# Patient Record
Sex: Female | Born: 1960 | Race: White | Hispanic: No | Marital: Single | Smoking: Never smoker
Health system: Southern US, Community
[De-identification: ages and names within clinical notes are randomized; demographics above are authoritative.]

## PROBLEM LIST (undated history)

## (undated) DIAGNOSIS — F32A Depression, unspecified: Secondary | ICD-10-CM

## (undated) DIAGNOSIS — F1911 Other psychoactive substance abuse, in remission: Secondary | ICD-10-CM

## (undated) DIAGNOSIS — F988 Other specified behavioral and emotional disorders with onset usually occurring in childhood and adolescence: Secondary | ICD-10-CM

## (undated) DIAGNOSIS — M1990 Unspecified osteoarthritis, unspecified site: Secondary | ICD-10-CM

## (undated) DIAGNOSIS — K649 Unspecified hemorrhoids: Secondary | ICD-10-CM

## (undated) DIAGNOSIS — F329 Major depressive disorder, single episode, unspecified: Secondary | ICD-10-CM

## (undated) DIAGNOSIS — I499 Cardiac arrhythmia, unspecified: Secondary | ICD-10-CM

## (undated) DIAGNOSIS — F419 Anxiety disorder, unspecified: Secondary | ICD-10-CM

## (undated) DIAGNOSIS — L309 Dermatitis, unspecified: Secondary | ICD-10-CM

## (undated) DIAGNOSIS — R011 Cardiac murmur, unspecified: Secondary | ICD-10-CM

## (undated) HISTORY — PX: OTHER SURGICAL HISTORY: SHX169

## (undated) HISTORY — PX: UMBILICAL HERNIA REPAIR: SHX196

---

## 1997-11-21 ENCOUNTER — Ambulatory Visit (HOSPITAL_COMMUNITY): Admission: RE | Admit: 1997-11-21 | Discharge: 1997-11-21 | Payer: Self-pay | Admitting: Obstetrics and Gynecology

## 1997-11-24 ENCOUNTER — Ambulatory Visit (HOSPITAL_COMMUNITY): Admission: RE | Admit: 1997-11-24 | Discharge: 1997-11-24 | Payer: Self-pay | Admitting: Obstetrics and Gynecology

## 1999-07-23 ENCOUNTER — Encounter: Admission: RE | Admit: 1999-07-23 | Discharge: 1999-07-23 | Payer: Self-pay | Admitting: *Deleted

## 1999-07-23 ENCOUNTER — Encounter: Payer: Self-pay | Admitting: *Deleted

## 1999-10-28 ENCOUNTER — Other Ambulatory Visit: Admission: RE | Admit: 1999-10-28 | Discharge: 1999-10-28 | Payer: Self-pay | Admitting: Obstetrics and Gynecology

## 2000-11-02 ENCOUNTER — Other Ambulatory Visit: Admission: RE | Admit: 2000-11-02 | Discharge: 2000-11-02 | Payer: Self-pay | Admitting: Obstetrics and Gynecology

## 2001-11-24 ENCOUNTER — Other Ambulatory Visit: Admission: RE | Admit: 2001-11-24 | Discharge: 2001-11-24 | Payer: Self-pay | Admitting: Obstetrics and Gynecology

## 2002-12-05 ENCOUNTER — Other Ambulatory Visit: Admission: RE | Admit: 2002-12-05 | Discharge: 2002-12-05 | Payer: Self-pay | Admitting: Obstetrics and Gynecology

## 2003-01-25 ENCOUNTER — Ambulatory Visit (HOSPITAL_COMMUNITY): Admission: RE | Admit: 2003-01-25 | Discharge: 2003-01-25 | Payer: Self-pay | Admitting: Obstetrics and Gynecology

## 2003-01-25 ENCOUNTER — Encounter (INDEPENDENT_AMBULATORY_CARE_PROVIDER_SITE_OTHER): Payer: Self-pay | Admitting: Specialist

## 2004-10-25 ENCOUNTER — Other Ambulatory Visit: Admission: RE | Admit: 2004-10-25 | Discharge: 2004-10-25 | Payer: Self-pay | Admitting: Obstetrics and Gynecology

## 2009-09-29 ENCOUNTER — Emergency Department (HOSPITAL_COMMUNITY): Admission: EM | Admit: 2009-09-29 | Discharge: 2009-09-29 | Payer: Self-pay | Admitting: Emergency Medicine

## 2010-03-05 ENCOUNTER — Emergency Department (HOSPITAL_COMMUNITY): Admission: EM | Admit: 2010-03-05 | Discharge: 2010-03-06 | Payer: Self-pay | Admitting: Emergency Medicine

## 2010-10-20 ENCOUNTER — Encounter: Payer: Self-pay | Admitting: Obstetrics and Gynecology

## 2010-12-15 LAB — RAPID URINE DRUG SCREEN, HOSP PERFORMED
Barbiturates: NOT DETECTED
Opiates: NOT DETECTED

## 2010-12-15 LAB — DIFFERENTIAL
Basophils Relative: 2 % — ABNORMAL HIGH (ref 0–1)
Eosinophils Absolute: 0.2 10*3/uL (ref 0.0–0.7)
Eosinophils Relative: 3 % (ref 0–5)
Monocytes Absolute: 0.4 10*3/uL (ref 0.1–1.0)
Monocytes Relative: 8 % (ref 3–12)

## 2010-12-15 LAB — CBC
HCT: 36.7 % (ref 36.0–46.0)
Hemoglobin: 12.3 g/dL (ref 12.0–15.0)
MCHC: 33.5 g/dL (ref 30.0–36.0)
MCV: 92 fL (ref 78.0–100.0)
RBC: 3.98 MIL/uL (ref 3.87–5.11)
RDW: 14.3 % (ref 11.5–15.5)

## 2010-12-15 LAB — URINALYSIS, ROUTINE W REFLEX MICROSCOPIC
Protein, ur: NEGATIVE mg/dL
Specific Gravity, Urine: 1.018 (ref 1.005–1.030)
Urobilinogen, UA: 1 mg/dL (ref 0.0–1.0)

## 2010-12-15 LAB — COMPREHENSIVE METABOLIC PANEL
ALT: 18 U/L (ref 0–35)
AST: 21 U/L (ref 0–37)
Albumin: 3.6 g/dL (ref 3.5–5.2)
Alkaline Phosphatase: 46 U/L (ref 39–117)
BUN: 13 mg/dL (ref 6–23)
CO2: 26 mEq/L (ref 19–32)
Calcium: 8.9 mg/dL (ref 8.4–10.5)
Chloride: 110 mEq/L (ref 96–112)
Creatinine, Ser: 0.59 mg/dL (ref 0.4–1.2)
GFR calc Af Amer: >60 mL/min (ref >60)
GFR calc non Af Amer: >60 mL/min (ref >60)
Glucose, Bld: 95 mg/dL (ref 70–99)
Potassium: 4 mEq/L (ref 3.5–5.1)
Sodium: 140 mEq/L (ref 135–145)
Total Bilirubin: 0.6 mg/dL (ref 0.3–1.2)
Total Protein: 6.3 g/dL (ref 6.0–8.3)

## 2010-12-15 LAB — PREGNANCY, URINE: Preg Test, Ur: NEGATIVE

## 2010-12-15 LAB — ETHANOL: Alcohol, Ethyl (B): <5 mg/dL (ref 0–10)

## 2010-12-16 LAB — URINALYSIS, ROUTINE W REFLEX MICROSCOPIC
Bilirubin Urine: NEGATIVE
Glucose, UA: NEGATIVE mg/dL
Protein, ur: NEGATIVE mg/dL

## 2010-12-16 LAB — DIFFERENTIAL
Eosinophils Absolute: 0.2 10*3/uL (ref 0.0–0.7)
Eosinophils Relative: 3 % (ref 0–5)
Lymphs Abs: 2 10*3/uL (ref 0.7–4.0)
Monocytes Relative: 9 % (ref 3–12)

## 2010-12-16 LAB — CBC
HCT: 39.5 % (ref 36.0–46.0)
MCV: 89.6 fL (ref 78.0–100.0)
RBC: 4.41 MIL/uL (ref 3.87–5.11)
WBC: 5.9 10*3/uL (ref 4.0–10.5)

## 2010-12-16 LAB — URINE MICROSCOPIC-ADD ON

## 2010-12-16 LAB — BASIC METABOLIC PANEL
BUN: 12 mg/dL (ref 6–23)
Chloride: 108 mEq/L (ref 96–112)
GFR calc Af Amer: >60 mL/min (ref >60)
Potassium: 3.7 mEq/L (ref 3.5–5.1)

## 2010-12-16 LAB — RAPID URINE DRUG SCREEN, HOSP PERFORMED
Barbiturates: NOT DETECTED
Cocaine: NOT DETECTED

## 2010-12-16 LAB — ETHANOL: Alcohol, Ethyl (B): <5 mg/dL (ref 0–10)

## 2011-02-14 NOTE — Op Note (Signed)
NAME:  Mary Sawyer, Mary Sawyer                  ACCOUNT NO.:  [id number]   MEDICAL RECORD NO.:  [id number]                   PATIENT TYPE:  AMB   LOCATION:  SDC                                  FACILITY:  [facility]   PHYSICIAN:  Duke Salvia. Marcelle Overlie, M.D.            DATE OF BIRTH:  [birth year]   DATE OF PROCEDURE:  01/25/2003  DATE OF DISCHARGE:                                 OPERATIVE REPORT   PREOPERATIVE DIAGNOSES:  1. Abnormal uterine bleeding.  2. Menorrhagia.  3. Requests sterilization.   POSTOPERATIVE DIAGNOSES:  1. Abnormal uterine bleeding.  2. Menorrhagia.  3. Requests sterilization.   PROCEDURE:  1. Laparoscopic tubal ligation by Filshie clip application.  2. Dilatation and curettage.  3. Hysteroscopy with cryo ablation.   SURGEON:  Duke Salvia. Marcelle Overlie, M.D.   ANESTHESIA:  General endotracheal.   COMPLICATIONS:  None.   DRAINS:  In-and-out Foley catheter.   ESTIMATED BLOOD LOSS:  Less than 5 mL.   PROCEDURE AND FINDINGS:  The patient is taken to the operating room after an  adequate level of general endotracheal anesthesia was obtained with the  patient's legs in stirrups.  The abdomen, perineum, and vagina prepped and  draped in a usual manner for laparoscopy, D&C.  The bladder was drained.  EUA was carried out.  The uterus was mid to posterior, normal size.  Adnexa  negative.  Cervix grasped with tenaculum.  Hulka was positioned for  posterior uterus.  Attention directed to the abdomen where a 2 cm  subumbilical incision was made after infiltrating with 0.5% Marcaine plain.  Small incision was made.  The Veress needle was introduced without  difficulty.  Its intra-abdominal position was verified by pressure and water  testing.  After 2.5 L pneumoperitoneum was then created laparoscopic trocar  and sleeve were then introduced without difficulty.  There was no evidence  of any bleeding or trauma.  The patient placed in Trendelenburg and the  uterus anteflexed.  The  pelvic findings were inspected.  The uterus was  retroflexed, but no other abnormalities in either adnexa or cul-de-sac were  noted.  0.5% Marcaine plain 4-5 mL was dripped across each tube from the  cornu to fimbriated end.  Filshie clip applicator was positioned.  The  Filshie clip applied to the right tube after positive ID and tracing it to  the fimbriated end.  This was applied 2 cm from the cornu at a right angle  completely engulfing the tube with excellent application.  The exact same  repeated on the opposite side.  After the Filshie clip applicator was  removed the scope was reinserted and pictures were taken showing excellent  application on either side.  The scope was left in temporarily for the  vaginal portion.  The legs were extended.  Speculum was positioned.  Cervix  grasped with a tenaculum.  Was sounded to 9.5 cm, progressively dilated to a  29 Pratt.  The cryo probe was  then inserted into the cornu at the  appropriate depth.  A six minute freeze cycle was carried out and thawed and  removed.  Four to five minutes were allowed to pass to allow the ice ball to  thaw.  The cryo probe was then repositioned in the right cornu and another  six minute freeze cycle was carried out.  This was observed laparoscopically  during the procedure which was uneventful.  This was thawed and removed.  She tolerated this well.  Went to recovery room in good condition.                                               Richard M. Marcelle Overlie, M.D.    RMH/MEDQ  D:  01/25/2003  T:  01/25/2003  Job:  [id number]

## 2011-02-14 NOTE — H&P (Signed)
   NAME:  Mary Sawyer, Mary Sawyer                  ACCOUNT NO.:  [id number]   MEDICAL RECORD NO.:  [id number]                   PATIENT TYPE:  AMB   LOCATION:  SDC                                  FACILITY:  [facility]   PHYSICIAN:  Duke Salvia. Marcelle Overlie, M.D.            DATE OF BIRTH:  12/30/1960   DATE OF ADMISSION:  01/24/2003  DATE OF DISCHARGE:                                HISTORY & PHYSICAL   CHIEF COMPLAINT:  Request for permanent sterilization, abnormal bleeding  with menorrhagia.   HISTORY OF PRESENT ILLNESS:  A 50 year old separated female, gravida 2, para  2, using abstinence for contraception currently. In the past several years  she has had problems with heavy irregular bleeding that did not respond well  to OCP's.  An FHD done January 05, 2003, showed a uterus that was 8.6 x 5.3 x  4.4, endometrium was 10.4 mm, adnexa negative.  On saline infusion, there  was irregular build-up on the posterior and the anterior wall.  We discussed  several treatment options.  She is scheduled now for D&C, hysteroscopy,  cryoablation, along with tubal ligation.  This procedure including the  permanence of the tubal procedure, failure rate of 2 to 3 per 1000 reviewed,  other complications such as bleeding, infection, the possible need for open  or additional surgery reviewed.  We discussed a 30 to 40% rate of amenorrhea  post cryo with an 80% hypomenorrhea rate post cryo which she understands.   MEDICATIONS:  Wellbutrin and Zoloft, Xanax p.r.n.   PAST MEDICAL HISTORY:  She is separated and not currently sexually active.   PAST OBSTETRICAL HISTORY:  She has had two vaginal deliveries at term  without complications.   REVIEW OF SYSTEMS:  Otherwise unremarkable.  She smokes rarely with  occasional alcohol use. She is exercising regularly. Last Pap March of 2004  was normal.   PHYSICAL EXAMINATION:  VITAL SIGNS: Temperature 98.2, blood pressure 120/78.  HEENT:  Unremarkable.  NECK:  Supple without  masses.  LUNGS:  Clear.  HEART:  Regular rate and rhythm without murmurs, rubs, or gallops noted.  BREASTS: Without masses.  ABDOMEN:  Soft, flat, and nontender.  PELVIC:  Normal external genitalia. Vagina and cervix are clear. Uterus is  midposition, normal size, adnexa negative.   IMPRESSION:  1. Menorrhagia with abnormal uterine bleeding.  2. Request for permanent sterilization.    PLAN:  D&C, hysteroscopy, cryoablation, tubal ligation by Filshie clip  application.  The procedure and risks reviewed as above.                                               Richard M. Marcelle Overlie, M.D.    RMH/MEDQ  D:  01/24/2003  T:  01/24/2003  Job:  [id number]

## 2014-10-20 NOTE — Progress Notes (Signed)
10-20-14 Need MD order entry in Epic, PAT appointment is 10-23-14 1300.

## 2014-10-23 ENCOUNTER — Encounter (HOSPITAL_COMMUNITY): Payer: Self-pay

## 2014-10-23 ENCOUNTER — Encounter (HOSPITAL_COMMUNITY)
Admission: RE | Admit: 2014-10-23 | Discharge: 2014-10-23 | Disposition: A | Discharge status: Home / Self Care | Payer: 59 | Source: Ambulatory Visit | Attending: Orthopedic Surgery | Admitting: Orthopedic Surgery

## 2014-10-23 DIAGNOSIS — Z01812 Encounter for preprocedural laboratory examination: Secondary | ICD-10-CM | POA: Insufficient documentation

## 2014-10-23 DIAGNOSIS — I341 Nonrheumatic mitral (valve) prolapse: Secondary | ICD-10-CM | POA: Insufficient documentation

## 2014-10-23 DIAGNOSIS — M1612 Unilateral primary osteoarthritis, left hip: Secondary | ICD-10-CM | POA: Insufficient documentation

## 2014-10-23 HISTORY — DX: Cardiac arrhythmia, unspecified: I49.9

## 2014-10-23 HISTORY — DX: Unspecified hemorrhoids: K64.9

## 2014-10-23 HISTORY — DX: Major depressive disorder, single episode, unspecified: F32.9

## 2014-10-23 HISTORY — DX: Dermatitis, unspecified: L30.9

## 2014-10-23 HISTORY — DX: Other psychoactive substance abuse, in remission: F19.11

## 2014-10-23 HISTORY — DX: Unspecified osteoarthritis, unspecified site: M19.90

## 2014-10-23 HISTORY — DX: Depression, unspecified: F32.A

## 2014-10-23 HISTORY — DX: Other specified behavioral and emotional disorders with onset usually occurring in childhood and adolescence: F98.8

## 2014-10-23 HISTORY — DX: Cardiac murmur, unspecified: R01.1

## 2014-10-23 HISTORY — DX: Anxiety disorder, unspecified: F41.9

## 2014-10-23 LAB — PROTIME-INR
INR: 1.09 (ref 0.00–1.49)
Prothrombin Time: 14.2 seconds (ref 11.6–15.2)

## 2014-10-23 LAB — SURGICAL PCR SCREEN
MRSA, PCR: NEGATIVE
Staphylococcus aureus: NEGATIVE

## 2014-10-23 LAB — URINALYSIS, ROUTINE W REFLEX MICROSCOPIC
Bilirubin Urine: NEGATIVE
Glucose, UA: NEGATIVE mg/dL
KETONES UR: NEGATIVE mg/dL
Leukocytes, UA: NEGATIVE
Nitrite: NEGATIVE
PH: 6 (ref 5.0–8.0)
Protein, ur: NEGATIVE mg/dL
SPECIFIC GRAVITY, URINE: 1.014 (ref 1.005–1.030)
Urobilinogen, UA: 0.2 mg/dL (ref 0.0–1.0)

## 2014-10-23 LAB — APTT: APTT: 31 s (ref 24–37)

## 2014-10-23 LAB — URINE MICROSCOPIC-ADD ON

## 2014-10-23 NOTE — Progress Notes (Signed)
Urinalysis and micro results per PAT visit 10/23/2014 in epic sent to Dr Linna CapriceSwinteck

## 2014-10-23 NOTE — Patient Instructions (Addendum)
20 Mary Sawyer  10/23/2014   Your procedure is scheduled on:    Thursday November 02, 2014  [facility] and follow signs to  Short Stay Center arrive at 9:30 AM.   Call this number if you have problems the morning of surgery [phone] or Presurgical Testing [phone].   Remember:  Do not eat food or drink liquids :After Midnight.     Take these medicines the morning of surgery with A SIP OF WATER: Gabapentin                               You may not have any metal on your body including hair pins and piercings  Do not wear jewelry, make-up, lotions, powders, prefume or deodorant.  Do not shave body hair  48 hours(2 days) of CHG soap use.                Do not bring valuables to the hospital.  IS NOT RESPONSIBLE FOR VALUABLES.  Contacts, dentures or bridgework may not be worn into surgery.  Leave suitcase in the car. After surgery it may be brought to your room.  For patients admitted to the hospital, checkout time is 11:00 AM the day of discharge.     Special Instructions: review fact sheets for MRSA information, Blood Transfusion fact sheet, Incentive Spirometry.  ________________________________________________________________________  [facility] - Preparing for Surgery Before surgery, you can play an important role.  Because skin is not sterile, your skin needs to be as free of germs as possible.  You can reduce the number of germs on your skin by washing with CHG (chlorahexidine gluconate) soap before surgery.  CHG is an antiseptic cleaner which kills germs and bonds with the skin to continue killing germs even after washing. Please DO NOT use if you have an allergy to CHG or antibacterial soaps.  If your skin becomes reddened/irritated stop using the CHG and inform your nurse when you arrive at Short Stay. Do not shave (including legs and underarms) for at least 48 hours prior to the first CHG shower.  You may shave your  face/neck. Please follow these instructions carefully:  1.  Shower with CHG Soap the night before surgery and the  morning of Surgery.  2.  If you choose to wash your hair, wash your hair first as usual with your  normal  shampoo.  3.  After you shampoo, rinse your hair and body thoroughly to remove the  shampoo.                           4.  Use CHG as you would any other liquid soap.  You can apply chg directly  to the skin and wash                       Gently with a scrungie or clean washcloth.  5.  Apply the CHG Soap to your body ONLY FROM THE NECK DOWN.   Do not use on face/ open                           Wound or open sores. Avoid contact with eyes, ears mouth and genitals (private parts).  Wash face,  Genitals (private parts) with your normal soap.             6.  Wash thoroughly, paying special attention to the area where your surgery  will be performed.  7.  Thoroughly rinse your body with warm water from the neck down.  8.  DO NOT shower/wash with your normal soap after using and rinsing off  the CHG Soap.                9.  Pat yourself dry with a clean towel.            10.  Wear clean pajamas.            11.  Place clean sheets on your bed the night of your first shower and do not  sleep with pets. Day of Surgery : Do not apply any lotions/deodorants the morning of surgery.  Please wear clean clothes to the hospital/surgery center.  FAILURE TO FOLLOW THESE INSTRUCTIONS MAY RESULT IN THE CANCELLATION OF YOUR SURGERY PATIENT SIGNATURE_________________________________  NURSE SIGNATURE__________________________________  ________________________________________________________________________

## 2014-10-23 NOTE — Progress Notes (Addendum)
Spoke with Dr Acey Lavarignan / anesthesia in regards to pt stating she had not eaten or drank anything prior to PAT visit gave pt a cup of water when informed would have to obtain urine specimen pt stated if she did give urine would be dark pt did give sample - large amount of clear light yellow urine pt denies ETOH and drug use but was noted in H&P obtained per Dr Wynelle LinkSun pt has hx of substance abuse anesthesia to see day of surgery Dr Acey Lavarignan also aware of pts HR and EKG reading

## 2014-10-23 NOTE — Progress Notes (Addendum)
Clearance note per chart Dr Deatra JamesVyvyan Sun 10/18/2014  LOV note per chart Dr Deatra JamesVyvyan Sun 10/18/2014  BMP and CBCD results per chart 10/18/2014  EKG per chart 10/18/2014

## 2014-10-24 ENCOUNTER — Other Ambulatory Visit: Payer: Self-pay | Admitting: Orthopedic Surgery

## 2014-10-24 NOTE — H&P (Signed)
TOTAL HIP ADMISSION H&P  Patient is admitted for left total hip arthroplasty.  Subjective:  Chief Complaint: left hip pain  HPI: Mary Sawyer, 54 y.o. female, has a history of pain and functional disability in the left hip(s) due to arthritis and patient has failed non-surgical conservative treatments for greater than 12 weeks to include NSAID's and/or analgesics, flexibility and strengthening excercises and activity modification.  Onset of symptoms was gradual starting 2 years ago with gradually worsening course since that time.The patient noted no past surgery on the left hip(s).  Patient currently rates pain in the left hip at 10 out of 10 with activity. Patient has night pain, worsening of pain with activity and weight bearing, pain that interfers with activities of daily living and pain with passive range of motion. Patient has evidence of subchondral cysts, subchondral sclerosis, periarticular osteophytes and joint space narrowing by imaging studies. This condition presents safety issues increasing the risk of falls. There is no current active infection.  There are no active problems to display for this patient.  Past Medical History  Diagnosis Date  . Dysrhythmia     MVP  . Heart murmur   . Depression   . Arthritis   . Anxiety   . Eczema     hx of   . ADD (attention deficit disorder)     hx of noted per H&P per Dr Sun 10/18/2014   . Hemorrhoids     hx of per H&P per Dr Sun 10/18/2014   . History of substance abuse     pt denies noted per H&P per Dr Sun 10/18/2014 ETOH and marijuana    Past Surgical History  Procedure Laterality Date  . Uterine ablation      hx of   . Colonscopy     . Umbilical hernia repair      hx of      (Not in a hospital admission) No Known Allergies  History  Substance Use Topics  . Smoking status: Never Smoker   . Smokeless tobacco: Never Used  . Alcohol Use: No    No family history on file.   Review of Systems  All other systems  reviewed and are negative.   Objective:  Physical Exam  Constitutional: She is oriented to person, place, and time. She appears well-developed and well-nourished.  HENT:  Head: Normocephalic and atraumatic.  Eyes: EOM are normal. Pupils are equal, round, and reactive to light.  Neck: Normal range of motion. Neck supple.  Cardiovascular: Normal rate, regular rhythm, normal heart sounds and intact distal pulses.  Exam reveals no gallop and no friction rub.   No murmur heard. Respiratory: Effort normal and breath sounds normal. No respiratory distress. She has no wheezes. She has no rales.  GI: Soft. Bowel sounds are normal. She exhibits no distension. There is no tenderness. There is no rebound and no guarding.  Genitourinary:  deferred  Musculoskeletal:  L hip with reduced ROM and pain. NVI.  Neurological: She is alert and oriented to person, place, and time.  Skin: Skin is warm and dry.  Psychiatric: She has a normal mood and affect.    Vital signs in last 24 hours: @VSRANGES@  Labs:   Estimated body mass index is 25.84 kg/(m^2) as calculated from the following:   Height as of 10/23/14: 5' 6" (1.676 m).   Weight as of 10/23/14: 72.576 kg (160 lb).   Imaging Review Plain radiographs demonstrate severe degenerative joint disease of the left hip(s).   The bone quality appears to be satisfactory for age and reported activity level.  Assessment/Plan:  End stage arthritis, left hip(s)  The patient history, physical examination, clinical judgement of the provider and imaging studies are consistent with end stage degenerative joint disease of the left hip(s) and total hip arthroplasty is deemed medically necessary. The treatment options including medical management, injection therapy, arthroscopy and arthroplasty were discussed at length. The risks and benefits of total hip arthroplasty were presented and reviewed. The risks due to aseptic loosening, infection, stiffness,  dislocation/subluxation,  thromboembolic complications and other imponderables were discussed.  The patient acknowledged the explanation, agreed to proceed with the plan and consent was signed. Patient is being admitted for inpatient treatment for surgery, pain control, PT, OT, prophylactic antibiotics, VTE prophylaxis, progressive ambulation and ADL's and discharge planning.The patient is planning to be discharged home with home health services 

## 2014-11-02 ENCOUNTER — Inpatient Hospital Stay (HOSPITAL_COMMUNITY)
Admission: RE | Admit: 2014-11-02 | Discharge: 2014-11-03 | DRG: 470 | Disposition: A | Discharge status: Home Health | Payer: 59 | Source: Ambulatory Visit | Attending: Orthopedic Surgery | Admitting: Orthopedic Surgery

## 2014-11-02 ENCOUNTER — Inpatient Hospital Stay (HOSPITAL_COMMUNITY): Payer: 59

## 2014-11-02 ENCOUNTER — Inpatient Hospital Stay (HOSPITAL_COMMUNITY): Payer: 59 | Admitting: Anesthesiology

## 2014-11-02 ENCOUNTER — Encounter (HOSPITAL_COMMUNITY)
Admission: RE | Disposition: A | Discharge status: Home Health | Payer: Self-pay | Source: Ambulatory Visit | Attending: Orthopedic Surgery

## 2014-11-02 ENCOUNTER — Encounter (HOSPITAL_COMMUNITY): Payer: Self-pay | Admitting: *Deleted

## 2014-11-02 DIAGNOSIS — Z01812 Encounter for preprocedural laboratory examination: Secondary | ICD-10-CM

## 2014-11-02 DIAGNOSIS — Z09 Encounter for follow-up examination after completed treatment for conditions other than malignant neoplasm: Secondary | ICD-10-CM

## 2014-11-02 DIAGNOSIS — R52 Pain, unspecified: Secondary | ICD-10-CM

## 2014-11-02 DIAGNOSIS — M1612 Unilateral primary osteoarthritis, left hip: Secondary | ICD-10-CM | POA: Diagnosis present

## 2014-11-02 DIAGNOSIS — M25552 Pain in left hip: Secondary | ICD-10-CM | POA: Diagnosis present

## 2014-11-02 HISTORY — PX: TOTAL HIP ARTHROPLASTY: SHX124

## 2014-11-02 LAB — TYPE AND SCREEN
ABO/RH(D): A POS
ANTIBODY SCREEN: NEGATIVE

## 2014-11-02 LAB — ABO/RH: ABO/RH(D): A POS

## 2014-11-02 SURGERY — ARTHROPLASTY, HIP, TOTAL, ANTERIOR APPROACH
Anesthesia: Spinal | Site: Hip | Laterality: Left

## 2014-11-02 MED ORDER — VITAMIN C 500 MG PO TABS
500.0000 mg | ORAL_TABLET | Freq: Every day | ORAL | Status: DC
  Administered 2014-11-03: 500 mg via ORAL
  Filled 2014-11-02: qty 1

## 2014-11-02 MED ORDER — EPHEDRINE SULFATE 50 MG/ML IJ SOLN
INTRAMUSCULAR | Status: DC | PRN
  Administered 2014-11-02: 15 mg via INTRAVENOUS

## 2014-11-02 MED ORDER — MENTHOL 3 MG MT LOZG
1.0000 | LOZENGE | OROMUCOSAL | Status: DC | PRN
  Filled 2014-11-02: qty 9

## 2014-11-02 MED ORDER — SODIUM CHLORIDE 0.9 % IJ SOLN
INTRAMUSCULAR | Status: DC | PRN
  Administered 2014-11-02: 30 mL

## 2014-11-02 MED ORDER — DEXAMETHASONE SODIUM PHOSPHATE 10 MG/ML IJ SOLN
INTRAMUSCULAR | Status: DC | PRN
  Administered 2014-11-02: 10 mg via INTRAVENOUS

## 2014-11-02 MED ORDER — HYDROCODONE-ACETAMINOPHEN 5-325 MG PO TABS
1.0000 | ORAL_TABLET | ORAL | Status: DC | PRN
  Administered 2014-11-02 – 2014-11-03 (×5): 2 via ORAL
  Filled 2014-11-02 (×5): qty 2

## 2014-11-02 MED ORDER — LAMOTRIGINE 200 MG PO TABS
200.0000 mg | ORAL_TABLET | Freq: Every day | ORAL | Status: DC
  Administered 2014-11-02: 200 mg via ORAL
  Filled 2014-11-02 (×2): qty 1

## 2014-11-02 MED ORDER — DOCUSATE SODIUM 100 MG PO CAPS
100.0000 mg | ORAL_CAPSULE | Freq: Two times a day (BID) | ORAL | Status: DC
  Administered 2014-11-02 – 2014-11-03 (×2): 100 mg via ORAL

## 2014-11-02 MED ORDER — PROPOFOL 10 MG/ML IV BOLUS
INTRAVENOUS | Status: AC
Start: 2014-11-02 — End: 2014-11-02
  Filled 2014-11-02: qty 20

## 2014-11-02 MED ORDER — LACTATED RINGERS IV SOLN
INTRAVENOUS | Status: DC
  Administered 2014-11-03: 02:00:00 via INTRAVENOUS

## 2014-11-02 MED ORDER — KETOROLAC TROMETHAMINE 30 MG/ML IJ SOLN
INTRAMUSCULAR | Status: AC
  Filled 2014-11-02: qty 1

## 2014-11-02 MED ORDER — 0.9 % SODIUM CHLORIDE (POUR BTL) OPTIME
TOPICAL | Status: DC | PRN
  Administered 2014-11-02: 1000 mL

## 2014-11-02 MED ORDER — SENNA 8.6 MG PO TABS
1.0000 | ORAL_TABLET | Freq: Two times a day (BID) | ORAL | Status: DC
  Administered 2014-11-02 – 2014-11-03 (×2): 8.6 mg via ORAL

## 2014-11-02 MED ORDER — PROMETHAZINE HCL 25 MG/ML IJ SOLN: 6.2500 mg | INTRAMUSCULAR | Status: DC | PRN

## 2014-11-02 MED ORDER — HYDROMORPHONE HCL 1 MG/ML IJ SOLN
INTRAMUSCULAR | Status: AC
  Filled 2014-11-02: qty 1

## 2014-11-02 MED ORDER — ISOPROPYL ALCOHOL 70 % SOLN
Status: AC
  Filled 2014-11-02: qty 480

## 2014-11-02 MED ORDER — ONDANSETRON HCL 4 MG/2ML IJ SOLN
INTRAMUSCULAR | Status: DC | PRN
  Administered 2014-11-02: 4 mg via INTRAVENOUS

## 2014-11-02 MED ORDER — PROPOFOL 10 MG/ML IV BOLUS
INTRAVENOUS | Status: AC
  Filled 2014-11-02: qty 20

## 2014-11-02 MED ORDER — HYDROMORPHONE HCL 1 MG/ML IJ SOLN
0.2500 mg | INTRAMUSCULAR | Status: DC | PRN
  Administered 2014-11-02 (×4): 0.5 mg via INTRAVENOUS

## 2014-11-02 MED ORDER — MEPERIDINE HCL 50 MG/ML IJ SOLN: 6.2500 mg | INTRAMUSCULAR | Status: DC | PRN

## 2014-11-02 MED ORDER — FENTANYL CITRATE 0.05 MG/ML IJ SOLN
INTRAMUSCULAR | Status: AC
  Filled 2014-11-02: qty 2

## 2014-11-02 MED ORDER — PHENYLEPHRINE 40 MCG/ML (10ML) SYRINGE FOR IV PUSH (FOR BLOOD PRESSURE SUPPORT)
PREFILLED_SYRINGE | INTRAVENOUS | Status: AC
  Filled 2014-11-02: qty 10

## 2014-11-02 MED ORDER — DEXAMETHASONE SODIUM PHOSPHATE 10 MG/ML IJ SOLN
INTRAMUSCULAR | Status: AC
  Filled 2014-11-02: qty 1

## 2014-11-02 MED ORDER — ONDANSETRON HCL 4 MG PO TABS: 4.0000 mg | ORAL_TABLET | Freq: Four times a day (QID) | ORAL | Status: DC | PRN

## 2014-11-02 MED ORDER — SODIUM CHLORIDE 0.9 % IR SOLN
Status: DC | PRN
  Administered 2014-11-02: 2000 mL

## 2014-11-02 MED ORDER — PHENOL 1.4 % MT LIQD
1.0000 | OROMUCOSAL | Status: DC | PRN
  Filled 2014-11-02: qty 177

## 2014-11-02 MED ORDER — ONDANSETRON HCL 4 MG/2ML IJ SOLN: 4.0000 mg | Freq: Four times a day (QID) | INTRAMUSCULAR | Status: DC | PRN

## 2014-11-02 MED ORDER — FENTANYL CITRATE 0.05 MG/ML IJ SOLN
50.0000 ug | INTRAMUSCULAR | Status: DC | PRN
  Administered 2014-11-02: 100 ug via INTRAVENOUS

## 2014-11-02 MED ORDER — GABAPENTIN 300 MG PO CAPS
300.0000 mg | ORAL_CAPSULE | Freq: Three times a day (TID) | ORAL | Status: DC
  Administered 2014-11-02 – 2014-11-03 (×3): 300 mg via ORAL
  Filled 2014-11-02 (×5): qty 1

## 2014-11-02 MED ORDER — FENTANYL CITRATE 0.05 MG/ML IJ SOLN
INTRAMUSCULAR | Status: DC | PRN
  Administered 2014-11-02: 100 ug via INTRAVENOUS

## 2014-11-02 MED ORDER — CEFAZOLIN SODIUM 1-5 GM-% IV SOLN
1.0000 g | Freq: Four times a day (QID) | INTRAVENOUS | Status: AC
  Administered 2014-11-02 – 2014-11-03 (×2): 1 g via INTRAVENOUS
  Filled 2014-11-02 (×2): qty 50

## 2014-11-02 MED ORDER — LACTATED RINGERS IV SOLN: INTRAVENOUS | Status: DC

## 2014-11-02 MED ORDER — METHOCARBAMOL 1000 MG/10ML IJ SOLN
500.0000 mg | Freq: Once | INTRAVENOUS | Status: AC
  Administered 2014-11-02: 500 mg via INTRAVENOUS
  Filled 2014-11-02: qty 5

## 2014-11-02 MED ORDER — ACETAMINOPHEN 650 MG RE SUPP: 650.0000 mg | Freq: Four times a day (QID) | RECTAL | Status: DC | PRN

## 2014-11-02 MED ORDER — CEFAZOLIN SODIUM-DEXTROSE 2-3 GM-% IV SOLR
INTRAVENOUS | Status: AC
  Filled 2014-11-02: qty 50

## 2014-11-02 MED ORDER — DIPHENHYDRAMINE HCL 12.5 MG/5ML PO ELIX: 12.5000 mg | ORAL_SOLUTION | ORAL | Status: DC | PRN

## 2014-11-02 MED ORDER — SODIUM CHLORIDE 0.9 % IV SOLN: INTRAVENOUS | Status: DC

## 2014-11-02 MED ORDER — FAMOTIDINE 20 MG PO TABS
20.0000 mg | ORAL_TABLET | Freq: Two times a day (BID) | ORAL | Status: DC
  Administered 2014-11-03: 20 mg via ORAL
  Filled 2014-11-02 (×4): qty 1

## 2014-11-02 MED ORDER — BUPIVACAINE-EPINEPHRINE 0.25% -1:200000 IJ SOLN
INTRAMUSCULAR | Status: DC | PRN
  Administered 2014-11-02: 30 mL

## 2014-11-02 MED ORDER — PROPOFOL INFUSION 10 MG/ML OPTIME
INTRAVENOUS | Status: DC | PRN
  Administered 2014-11-02: 140 ug/kg/min via INTRAVENOUS

## 2014-11-02 MED ORDER — ACETAMINOPHEN 10 MG/ML IV SOLN
1000.0000 mg | Freq: Once | INTRAVENOUS | Status: AC
  Administered 2014-11-02: 1000 mg via INTRAVENOUS
  Filled 2014-11-02: qty 100

## 2014-11-02 MED ORDER — MIDAZOLAM HCL 5 MG/5ML IJ SOLN
INTRAMUSCULAR | Status: DC | PRN
  Administered 2014-11-02: 2 mg via INTRAVENOUS

## 2014-11-02 MED ORDER — ONDANSETRON HCL 4 MG/2ML IJ SOLN
INTRAMUSCULAR | Status: AC
  Filled 2014-11-02: qty 2

## 2014-11-02 MED ORDER — PHENYLEPHRINE HCL 10 MG/ML IJ SOLN
INTRAMUSCULAR | Status: DC | PRN
  Administered 2014-11-02: 80 ug via INTRAVENOUS

## 2014-11-02 MED ORDER — CHLORHEXIDINE GLUCONATE 4 % EX LIQD: 60.0000 mL | Freq: Once | CUTANEOUS | Status: DC

## 2014-11-02 MED ORDER — BUPIVACAINE HCL (PF) 0.5 % IJ SOLN
INTRAMUSCULAR | Status: DC | PRN
  Administered 2014-11-02: 15 mg

## 2014-11-02 MED ORDER — METOCLOPRAMIDE HCL 5 MG/ML IJ SOLN: 5.0000 mg | Freq: Three times a day (TID) | INTRAMUSCULAR | Status: DC | PRN

## 2014-11-02 MED ORDER — TRANEXAMIC ACID 100 MG/ML IV SOLN
1000.0000 mg | INTRAVENOUS | Status: AC
  Administered 2014-11-02: 1000 mg via INTRAVENOUS
  Filled 2014-11-02: qty 10

## 2014-11-02 MED ORDER — KETOROLAC TROMETHAMINE 15 MG/ML IJ SOLN
15.0000 mg | Freq: Four times a day (QID) | INTRAMUSCULAR | Status: AC
  Administered 2014-11-02 – 2014-11-03 (×4): 15 mg via INTRAVENOUS
  Filled 2014-11-02 (×4): qty 1

## 2014-11-02 MED ORDER — DICLOFENAC SODIUM 75 MG PO TBEC
75.0000 mg | DELAYED_RELEASE_TABLET | Freq: Two times a day (BID) | ORAL | Status: DC
  Filled 2014-11-02: qty 1

## 2014-11-02 MED ORDER — MIDAZOLAM HCL 2 MG/2ML IJ SOLN
INTRAMUSCULAR | Status: AC
  Filled 2014-11-02: qty 2

## 2014-11-02 MED ORDER — HYDROMORPHONE HCL 1 MG/ML IJ SOLN
0.5000 mg | INTRAMUSCULAR | Status: DC | PRN
  Administered 2014-11-02 – 2014-11-03 (×3): 0.5 mg via INTRAVENOUS
  Filled 2014-11-02 (×2): qty 1

## 2014-11-02 MED ORDER — METOCLOPRAMIDE HCL 5 MG PO TABS
5.0000 mg | ORAL_TABLET | Freq: Three times a day (TID) | ORAL | Status: DC | PRN
  Filled 2014-11-02: qty 2

## 2014-11-02 MED ORDER — BUPIVACAINE-EPINEPHRINE (PF) 0.25% -1:200000 IJ SOLN
INTRAMUSCULAR | Status: AC
  Filled 2014-11-02: qty 30

## 2014-11-02 MED ORDER — LACTATED RINGERS IV SOLN
INTRAVENOUS | Status: DC
  Administered 2014-11-02: 1000 mL via INTRAVENOUS
  Administered 2014-11-02 (×2): via INTRAVENOUS

## 2014-11-02 MED ORDER — ACETAMINOPHEN 325 MG PO TABS: 650.0000 mg | ORAL_TABLET | Freq: Four times a day (QID) | ORAL | Status: DC | PRN

## 2014-11-02 MED ORDER — SODIUM CHLORIDE 0.9 % IJ SOLN
INTRAMUSCULAR | Status: AC
  Filled 2014-11-02: qty 50

## 2014-11-02 MED ORDER — ASPIRIN EC 325 MG PO TBEC
325.0000 mg | DELAYED_RELEASE_TABLET | Freq: Two times a day (BID) | ORAL | Status: DC
  Administered 2014-11-03: 325 mg via ORAL
  Filled 2014-11-02 (×3): qty 1

## 2014-11-02 MED ORDER — CEFAZOLIN SODIUM-DEXTROSE 2-3 GM-% IV SOLR
2.0000 g | INTRAVENOUS | Status: AC
  Administered 2014-11-02: 2 g via INTRAVENOUS

## 2014-11-02 MED ORDER — KETOROLAC TROMETHAMINE 30 MG/ML IJ SOLN
INTRAMUSCULAR | Status: DC | PRN
  Administered 2014-11-02: 30 mg

## 2014-11-02 SURGICAL SUPPLY — 49 items
BAG SPEC THK2 15X12 ZIP CLS (MISCELLANEOUS)
BAG ZIPLOCK 12X15 (MISCELLANEOUS) ×1 IMPLANT
CAPT HIP TOTAL 2 ×1 IMPLANT
CHLORAPREP W/TINT 26ML (MISCELLANEOUS) ×1 IMPLANT
COVER PERINEAL POST (MISCELLANEOUS) ×2 IMPLANT
DRAPE C-ARM 42X120 X-RAY (DRAPES) ×2 IMPLANT
DRAPE STERI IOBAN 125X83 (DRAPES) ×2 IMPLANT
DRAPE U-SHAPE 47X51 STRL (DRAPES) ×6 IMPLANT
DRSG AQUACEL AG ADV 3.5X10 (GAUZE/BANDAGES/DRESSINGS) ×2 IMPLANT
ELECT BLADE TIP CTD 4 INCH (ELECTRODE) ×2 IMPLANT
ELECT REM PT RETURN 9FT ADLT (ELECTROSURGICAL) ×2
ELECTRODE REM PT RTRN 9FT ADLT (ELECTROSURGICAL) ×1 IMPLANT
FACESHIELD WRAPAROUND (MASK) ×8 IMPLANT
FACESHIELD WRAPAROUND OR TEAM (MASK) ×4 IMPLANT
GLOVE BIO SURGEON STRL SZ7 (GLOVE) ×1 IMPLANT
GLOVE BIO SURGEON STRL SZ7.5 (GLOVE) ×1 IMPLANT
GLOVE BIO SURGEON STRL SZ8.5 (GLOVE) ×1 IMPLANT
GLOVE BIOGEL PI IND STRL 7.0 (GLOVE) IMPLANT
GLOVE BIOGEL PI IND STRL 7.5 (GLOVE) IMPLANT
GLOVE BIOGEL PI IND STRL 8.5 (GLOVE) ×1 IMPLANT
GLOVE BIOGEL PI INDICATOR 7.0 (GLOVE) ×1
GLOVE BIOGEL PI INDICATOR 7.5 (GLOVE) ×1
GLOVE BIOGEL PI INDICATOR 8.5 (GLOVE) ×1
GOWN SPEC L3 XXLG W/TWL (GOWN DISPOSABLE) ×2 IMPLANT
GOWN STRL REUS W/TWL LRG LVL3 (GOWN DISPOSABLE) ×2 IMPLANT
GOWN STRL REUS W/TWL XL LVL3 (GOWN DISPOSABLE) ×2 IMPLANT
HANDPIECE INTERPULSE COAX TIP (DISPOSABLE) ×2
HOLDER FOLEY CATH W/STRAP (MISCELLANEOUS) ×2 IMPLANT
HOOD PEEL AWAY FACE SHEILD DIS (HOOD) ×1 IMPLANT
KIT BASIN OR (CUSTOM PROCEDURE TRAY) ×2 IMPLANT
LIQUID BAND (GAUZE/BANDAGES/DRESSINGS) ×2 IMPLANT
NDL SAFETY ECLIPSE 18X1.5 (NEEDLE) ×1 IMPLANT
NEEDLE HYPO 18GX1.5 SHARP (NEEDLE)
PACK TOTAL JOINT (CUSTOM PROCEDURE TRAY) ×2 IMPLANT
SAW OSC TIP CART 19.5X105X1.3 (SAW) ×2 IMPLANT
SET HNDPC FAN SPRY TIP SCT (DISPOSABLE) IMPLANT
SUT ETHIBOND NAB CT1 #1 30IN (SUTURE) ×2 IMPLANT
SUT MNCRL AB 3-0 PS2 18 (SUTURE) ×1 IMPLANT
SUT MNCRL AB 4-0 PS2 18 (SUTURE) ×1 IMPLANT
SUT MON AB 2-0 SH 27 (SUTURE) ×4
SUT MON AB 2-0 SH27 (SUTURE) IMPLANT
SUT VIC AB 1 CT1 36 (SUTURE) ×4 IMPLANT
SUT VIC AB 2-0 CT1 27 (SUTURE) ×4
SUT VIC AB 2-0 CT1 TAPERPNT 27 (SUTURE) ×2 IMPLANT
SUT VLOC 180 0 24IN GS25 (SUTURE) ×2 IMPLANT
SYR 50ML LL SCALE MARK (SYRINGE) ×1 IMPLANT
TOWEL OR 17X26 10 PK STRL BLUE (TOWEL DISPOSABLE) ×2 IMPLANT
TRAY FOLEY CATH 14FRSI W/METER (CATHETERS) ×2 IMPLANT
WATER STERILE IRR 1500ML POUR (IV SOLUTION) ×2 IMPLANT

## 2014-11-02 NOTE — Anesthesia Preprocedure Evaluation (Addendum)
Anesthesia Evaluation  Patient identified by MRN, date of birth, ID band Patient awake    Reviewed: Allergy & Precautions, NPO status , Patient's Chart, lab work & pertinent test results  Airway Mallampati: II  TM Distance: >3 FB Neck ROM: Full    Dental no notable dental hx.    Pulmonary neg pulmonary ROS,  breath sounds clear to auscultation  Pulmonary exam normal       Cardiovascular negative cardio ROS  Rhythm:Regular Rate:Normal     Neuro/Psych negative neurological ROS  negative psych ROS   GI/Hepatic negative GI ROS, Neg liver ROS,   Endo/Other  negative endocrine ROS  Renal/GU negative Renal ROS  negative genitourinary   Musculoskeletal negative musculoskeletal ROS (+)   Abdominal   Peds negative pediatric ROS (+)  Hematology negative hematology ROS (+)   Anesthesia Other Findings   Reproductive/Obstetrics negative OB ROS                             Anesthesia Physical Anesthesia Plan  ASA: II  Anesthesia Plan: Spinal   Post-op Pain Management:    Induction:   Airway Management Planned: Simple Face Mask  Additional Equipment:   Intra-op Plan:   Post-operative Plan:   Informed Consent: I have reviewed the patients History and Physical, chart, labs and discussed the procedure including the risks, benefits and alternatives for the proposed anesthesia with the patient or authorized representative who has indicated his/her understanding and acceptance.   Dental advisory given  Plan Discussed with: CRNA  Anesthesia Plan Comments:         Anesthesia Quick Evaluation  

## 2014-11-02 NOTE — Brief Op Note (Signed)
11/02/2014  3:11 PM  PATIENT:  Mary Sawyer  54 y.o. female  PRE-OPERATIVE DIAGNOSIS:  left hip osteoarthritis  POST-OPERATIVE DIAGNOSIS:  left hip osteoarthritis  PROCEDURE:  Procedure(s): LEFT TOTAL HIP ARTHROPLASTY ANTERIOR APPROACH (Left)  SURGEON:  Surgeon(s) and Role:    * Garnet KoyanagiBrian James Kalasia Crafton, MD - Primary  PHYSICIAN ASSISTANT:   ASSISTANTS: Velta AddisonShareen Davenport, RNFA   ANESTHESIA:   spinal  EBL:  Total I/O In: 2000 [I.V.:2000] Out: 600 [Urine:300; Blood:300]  BLOOD ADMINISTERED:none  DRAINS: none   LOCAL MEDICATIONS USED:  MARCAINE     SPECIMEN:  No Specimen  DISPOSITION OF SPECIMEN:  N/A  COUNTS:  YES  TOURNIQUET:  * No tourniquets in log *  DICTATION: .Note written in EPIC  PLAN OF CARE: Admit to inpatient   PATIENT DISPOSITION:  PACU - hemodynamically stable.   Delay start of Pharmacological VTE agent (>24hrs) due to surgical blood loss or risk of bleeding: no

## 2014-11-02 NOTE — Interval H&P Note (Signed)
History and Physical Interval Note:  11/02/2014 12:24 PM  Mary Sawyer  has presented today for surgery, with the diagnosis of left hip osteoarthritis  The various methods of treatment have been discussed with the patient and family. After consideration of risks, benefits and other options for treatment, the patient has consented to  Procedure(s): LEFT TOTAL HIP ARTHROPLASTY ANTERIOR APPROACH (Left) as a surgical intervention .  The patient's history has been reviewed, patient examined, no change in status, stable for surgery.  I have reviewed the patient's chart and labs.  Questions were answered to the patient's satisfaction.     Pharrah Rottman, Cloyde ReamsBrian James

## 2014-11-02 NOTE — Anesthesia Procedure Notes (Signed)
Spinal Patient location during procedure: OR Start time: 11/02/2014 12:28 PM End time: 11/02/2014 12:30 PM Staffing Resident/CRNA: Carmelia RollerALDAY, Sriman Tally Baruch R Performed by: resident/CRNA  Preanesthetic Checklist Completed: patient identified, site marked, surgical consent, pre-op evaluation, timeout performed, IV checked, risks and benefits discussed and monitors and equipment checked Spinal Block Patient position: sitting Prep: Betadine Patient monitoring: heart rate, cardiac monitor, continuous pulse ox and blood pressure Approach: midline Location: L3-4 Injection technique: single-shot Needle Needle type: Sprotte  Needle gauge: 24 G Needle length: 9 cm Needle insertion depth: 6 cm Assessment Sensory level: T6

## 2014-11-02 NOTE — H&P (View-Only) (Signed)
TOTAL HIP ADMISSION H&P  Patient is admitted for left total hip arthroplasty.  Subjective:  Chief Complaint: left hip pain  HPI: Mary Sawyer, 54 y.o. female, has a history of pain and functional disability in the left hip(s) due to arthritis and patient has failed non-surgical conservative treatments for greater than 12 weeks to include NSAID's and/or analgesics, flexibility and strengthening excercises and activity modification.  Onset of symptoms was gradual starting 2 years ago with gradually worsening course since that time.The patient noted no past surgery on the left hip(s).  Patient currently rates pain in the left hip at 10 out of 10 with activity. Patient has night pain, worsening of pain with activity and weight bearing, pain that interfers with activities of daily living and pain with passive range of motion. Patient has evidence of subchondral cysts, subchondral sclerosis, periarticular osteophytes and joint space narrowing by imaging studies. This condition presents safety issues increasing the risk of falls. There is no current active infection.  There are no active problems to display for this patient.  Past Medical History  Diagnosis Date  . Dysrhythmia     MVP  . Heart murmur   . Depression   . Arthritis   . Anxiety   . Eczema     hx of   . ADD (attention deficit disorder)     hx of noted per H&P per Dr Wynelle LinkSun 10/18/2014   . Hemorrhoids     hx of per H&P per Dr Wynelle LinkSun 10/18/2014   . History of substance abuse     pt denies noted per H&P per Dr Wynelle LinkSun 10/18/2014 ETOH and marijuana    Past Surgical History  Procedure Laterality Date  . Uterine ablation      hx of   . Colonscopy     . Umbilical hernia repair      hx of      (Not in a hospital admission) No Known Allergies  History  Substance Use Topics  . Smoking status: Never Smoker   . Smokeless tobacco: Never Used  . Alcohol Use: No    No family history on file.   Review of Systems  All other systems  reviewed and are negative.   Objective:  Physical Exam  Constitutional: She is oriented to person, place, and time. She appears well-developed and well-nourished.  HENT:  Head: Normocephalic and atraumatic.  Eyes: EOM are normal. Pupils are equal, round, and reactive to light.  Neck: Normal range of motion. Neck supple.  Cardiovascular: Normal rate, regular rhythm, normal heart sounds and intact distal pulses.  Exam reveals no gallop and no friction rub.   No murmur heard. Respiratory: Effort normal and breath sounds normal. No respiratory distress. She has no wheezes. She has no rales.  GI: Soft. Bowel sounds are normal. She exhibits no distension. There is no tenderness. There is no rebound and no guarding.  Genitourinary:  deferred  Musculoskeletal:  L hip with reduced ROM and pain. NVI.  Neurological: She is alert and oriented to person, place, and time.  Skin: Skin is warm and dry.  Psychiatric: She has a normal mood and affect.    Vital signs in last 24 hours: @VSRANGES @  Labs:   Estimated body mass index is 25.84 kg/(m^2) as calculated from the following:   Height as of 10/23/14: 5\' 6"  (1.676 m).   Weight as of 10/23/14: 72.576 kg (160 lb).   Imaging Review Plain radiographs demonstrate severe degenerative joint disease of the left hip(s).  The bone quality appears to be satisfactory for age and reported activity level.  Assessment/Plan:  End stage arthritis, left hip(s)  The patient history, physical examination, clinical judgement of the provider and imaging studies are consistent with end stage degenerative joint disease of the left hip(s) and total hip arthroplasty is deemed medically necessary. The treatment options including medical management, injection therapy, arthroscopy and arthroplasty were discussed at length. The risks and benefits of total hip arthroplasty were presented and reviewed. The risks due to aseptic loosening, infection, stiffness,  dislocation/subluxation,  thromboembolic complications and other imponderables were discussed.  The patient acknowledged the explanation, agreed to proceed with the plan and consent was signed. Patient is being admitted for inpatient treatment for surgery, pain control, PT, OT, prophylactic antibiotics, VTE prophylaxis, progressive ambulation and ADL's and discharge planning.The patient is planning to be discharged home with home health services

## 2014-11-02 NOTE — Transfer of Care (Addendum)
Immediate Anesthesia Transfer of Care Note  Patient: Mary Sawyer  Procedure(s) Performed: Procedure(s) (LRB): LEFT TOTAL HIP ARTHROPLASTY ANTERIOR APPROACH (Left)  Patient Location: PACU  Anesthesia Type: Spinal  Level of Consciousness: sedated, patient cooperative and responds to stimulation  Airway & Oxygen Therapy: Patient Spontanous Breathing and Patient connected to face mask oxgen  Post-op Assessment: Report given to PACU RN and Post -op Vital signs reviewed and stable  Post vital signs: Reviewed and stable  Complications: No apparent anesthesia complications. Patient able to move bilat lower ext on command, stated discomfort to hip on exam.  Medication given by RN staff.

## 2014-11-02 NOTE — Op Note (Signed)
OPERATIVE REPORT  SURGEON: Samson Frederic, MD   ASSISTANT: Velta Addison, RNFA  PREOPERATIVE DIAGNOSIS: Left hip arthritis.   POSTOPERATIVE DIAGNOSIS: Left hip arthritis.   PROCEDURE: Left total hip arthroplasty, anterior approach.   IMPLANTS: DePuy Tri Lock stem, size 4, high offset. DePuy Pinnacle Cup, size 54 mm. DePuy Marathon liner, size 54 by 36 mm, +4 neutral. DePuy Biolox ceramic head ball, size 36 + 1.5 mm.  ANESTHESIA:  Spinal  ESTIMATED BLOOD LOSS: 300 mL.  ANTIBIOTICS: 2g ancef.  DRAINS: None.  COMPLICATIONS: None   CONDITION: PACU - hemodynamically stable.   BRIEF CLINICAL NOTE: Mary Sawyer is a 54 y.o. female with a long-standing history of Left hip arthritis. After failing conservative management, the patient was indicated for total hip arthroplasty. The risks, benefits, and alternatives to the procedure were explained, and the patient elected to proceed.  PROCEDURE IN DETAIL: Spinal anesthesia was obtained in the pre-op holding area. Once inside the operative room, a foley catheter was inserted. The patient was then positioned on the Hana table. All bony prominences were well padded. The hip was prepped and draped in the normal sterile surgical fashion. A time-out was called verifying side and site of surgery. The patient received IV antibiotics within 60 minutes of beginning the procedure.  The direct anterior approach to the hip was performed through the Hueter interval. Lateral femoral circumflex vessels were treated with the Auqumantys. The anterior capsule was exposed and an inverted T capsulotomy was made.The femoral neck cut was made to the level of the templated cut. A corkscrew was placed into the head and the head was removed. The femoral head was found to have eburnated bone. The head was passed to the back table and was measured.  Acetabular exposure was achieved, and the pulvinar and labrum were excised. Sequental  reaming of the acetabulum was then performed up to a size 53 mm reamer. A 54 mm cup was then opened and impacted into place at approximately 40 degrees of abduction and 20 degrees of anteversion. The final polyethylene liner was impacted into place and acetabular osteophytes were removed.   I then gained femoral exposure taking care to protect the abductors and greater trochanter. This was performed using standard external rotation, extension, and adduction. The capsule was peeled off the inner aspect of the greater trochanter, taking care to preserve the short external rotators. A cookie cutter was used to enter the femoral canal, and then the femoral canal finder was placed. Sequential broaching was performed up to a size 4. Calcar planer was used on the femoral neck remnant. I paced a high offset neck and a trial head ball. The hip was reduced. Leg lengths and offset were checked fluoroscopically. The hip was dislocated and trial components were removed. The final implants were placed, and the hip was reduced.  Fluoroscopy was used to confirm component position and leg lengths. At 90 degrees of external rotation and full extension, the hip was stable to an anterior directed force.  The wound was copiously irrigated with a dilute betadine solution followed by normal saline. Exparel solution was injected into the periarticular soft tissue. The wound was closed in layers using #1 Vicryl and V-Loc for the fascia, 2-0 Vicryl for the subcutaneous fat, 2-0 Monocryl for the deep dermal layer, 3-0 running Monocryl subcuticular Stitch, and Dermabond for the skin. Once the glue was fully dried, an Aquacell Ag dressing was applied. The patient was transported to the recovery room in stable ondition. Sponge, needle,  and instrument counts were correct at the nd of the case x2. The patient tolerated the procedure well and there were no known complications.

## 2014-11-02 NOTE — Anesthesia Postprocedure Evaluation (Signed)
  Anesthesia Post-op Note  Patient: Mary Sawyer  Procedure(s) Performed: Procedure(s) (LRB): LEFT TOTAL HIP ARTHROPLASTY ANTERIOR APPROACH (Left)  Patient Location: PACU  Anesthesia Type: Spinal  Level of Consciousness: awake and alert   Airway and Oxygen Therapy: Patient Spontanous Breathing  Post-op Pain: mild  Post-op Assessment: Post-op Vital signs reviewed, Patient's Cardiovascular Status Stable, Respiratory Function Stable, Patent Airway and No signs of Nausea or vomiting  Last Vitals:  Filed Vitals:   11/02/14 1652  BP: 112/65  Pulse: 65  Temp: 36.7 C  Resp: 18    Post-op Vital Signs: stable   Complications: No apparent anesthesia complications

## 2014-11-03 ENCOUNTER — Encounter (HOSPITAL_COMMUNITY): Payer: Self-pay | Admitting: Orthopedic Surgery

## 2014-11-03 LAB — BASIC METABOLIC PANEL
ANION GAP: 8 (ref 5–15)
BUN: 14 mg/dL (ref 6–23)
CO2: 26 mmol/L (ref 19–32)
CREATININE: 0.64 mg/dL (ref 0.50–1.10)
Calcium: 8.8 mg/dL (ref 8.4–10.5)
Chloride: 107 mmol/L (ref 96–112)
GFR calc Af Amer: >90 mL/min (ref >90)
GFR calc non Af Amer: >90 mL/min (ref >90)
Glucose, Bld: 126 mg/dL — ABNORMAL HIGH (ref 70–99)
Potassium: 3.8 mmol/L (ref 3.5–5.1)
Sodium: 141 mmol/L (ref 135–145)

## 2014-11-03 LAB — CBC
HEMATOCRIT: 30.2 % — AB (ref 36.0–46.0)
Hemoglobin: 9.7 g/dL — ABNORMAL LOW (ref 12.0–15.0)
MCH: 29 pg (ref 26.0–34.0)
MCHC: 32.1 g/dL (ref 30.0–36.0)
MCV: 90.4 fL (ref 78.0–100.0)
PLATELETS: 302 10*3/uL (ref 150–400)
RBC: 3.34 MIL/uL — ABNORMAL LOW (ref 3.87–5.11)
RDW: 13.4 % (ref 11.5–15.5)
WBC: 10.5 10*3/uL (ref 4.0–10.5)

## 2014-11-03 MED ORDER — HYDROCODONE-ACETAMINOPHEN 5-325 MG PO TABS: 1.0000 | ORAL_TABLET | ORAL | Status: AC | PRN

## 2014-11-03 MED ORDER — SENNA 8.6 MG PO TABS: 2.0000 | ORAL_TABLET | Freq: Every day | ORAL | Status: AC

## 2014-11-03 MED ORDER — DOCUSATE SODIUM 100 MG PO CAPS: 100.0000 mg | ORAL_CAPSULE | Freq: Two times a day (BID) | ORAL | Status: AC

## 2014-11-03 MED ORDER — ASPIRIN 325 MG PO TBEC: 325.0000 mg | DELAYED_RELEASE_TABLET | Freq: Two times a day (BID) | ORAL | Status: AC

## 2014-11-03 MED ORDER — DICLOFENAC SODIUM 75 MG PO TBEC: 75.0000 mg | DELAYED_RELEASE_TABLET | Freq: Two times a day (BID) | ORAL | Status: AC

## 2014-11-03 MED ORDER — LIP MEDEX EX OINT
TOPICAL_OINTMENT | CUTANEOUS | Status: AC
  Filled 2014-11-03: qty 7

## 2014-11-03 MED ORDER — FAMOTIDINE 20 MG PO TABS: 20.0000 mg | ORAL_TABLET | Freq: Two times a day (BID) | ORAL | Status: AC

## 2014-11-03 MED ORDER — ONDANSETRON 4 MG PO TBDP: 4.0000 mg | ORAL_TABLET | Freq: Three times a day (TID) | ORAL | Status: AC | PRN

## 2014-11-03 NOTE — Clinical Social Work Note (Signed)
Referred to CSW for ?SNF. Chart reviewed and noted plans to d/c home with HH and DME. CSW to sign off- please contact us if SW needs arise. Sabryna Lahm, MSW, LCSWA [phone]  

## 2014-11-03 NOTE — Evaluation (Signed)
Physical Therapy Evaluation Patient Details Name: Mary Sawyer MRN: [id number] DOB: [birth year] Today's Date: 11/03/2014   History of Present Illness  s/p L DA THA;  Clinical Impression  Pt will benefit from PT to address deficits below;  Pt hopeful to go home today; will see again this pm and practice stairs    Follow Up Recommendations Home health PT    Equipment Recommendations  Rolling walker with 5" wheels    Recommendations for Other Services       Precautions / Restrictions Precautions Precautions: Fall Restrictions Other Position/Activity Restrictions: WBAT      Mobility  Bed Mobility Overal bed mobility: Needs Assistance Bed Mobility: Sit to Supine       Sit to supine: Min assist;Min guard   General bed mobility comments: cues for tecnique; min/guard for LLE  Transfers Overall transfer level: Needs assistance Equipment used: Rolling walker (2 wheeled) Transfers: Sit to/from Stand Sit to Stand: Min guard         General transfer comment: cues for hand placement and RW safety  Ambulation/Gait Ambulation/Gait assistance: Min guard Ambulation Distance (Feet): 160 Feet Assistive device: Rolling walker (2 wheeled) Gait Pattern/deviations: Step-to pattern;Decreased step length - left;Decreased stance time - right     General Gait Details: cues for sequence  Stairs            Wheelchair Mobility    Modified Rankin (Stroke Patients Only)       Balance                                             Pertinent Vitals/Pain Pain Assessment: 0-10 Pain Location: L hip Pain Descriptors / Indicators: Discomfort Pain Intervention(s): Limited activity within patient's tolerance;Monitored during session;RN gave pain meds during session    Home Living Family/patient expects to be discharged to:: Private residence Living Arrangements: Alone Available Help at Discharge: Family Type of Home: Apartment Home Access: Stairs to  enter   Secretary/administrator of Steps: 3 Home Layout: One level Home Equipment: None Additional Comments: pt planning to stay downstairs with friend    Prior Function Level of Independence: Independent               Hand Dominance        Extremity/Trunk Assessment   Upper Extremity Assessment: Overall WFL for tasks assessed           Lower Extremity Assessment: LLE deficits/detail   LLE Deficits / Details: grossly 3 to 3+/5; AAROM WFL     Communication   Communication: No difficulties  Cognition Arousal/Alertness: Awake/alert Behavior During Therapy: WFL for tasks assessed/performed Overall Cognitive Status: Within Functional Limits for tasks assessed                      General Comments      Exercises Total Joint Exercises Ankle Circles/Pumps: AROM;Both;10 reps Quad Sets: AROM;10 reps;Both Short Arc Quad: AROM;Left;10 reps Heel Slides: AROM;Left;10 reps;AAROM Hip ABduction/ADduction: AROM;AAROM;Left;15 reps      Assessment/Plan    PT Assessment Patient needs continued PT services  PT Diagnosis Difficulty walking   PT Problem List Decreased strength;Decreased activity tolerance;Decreased mobility;Decreased range of motion  PT Treatment Interventions DME instruction;Gait training;Functional mobility training;Therapeutic activities;Patient/family education;Stair training;Therapeutic exercise   PT Goals (Current goals can be found in the Care Plan section) Acute Rehab PT Goals Patient Stated Goal: to  get back to her apt  PT Goal Formulation: With patient Time For Goal Achievement: 11/10/14 Potential to Achieve Goals: Good    Frequency 7X/week   Barriers to discharge        Co-evaluation               End of Session Equipment Utilized During Treatment: Gait belt Activity Tolerance: Patient tolerated treatment well Patient left: in bed;with call bell/phone within reach           Time: [id number] PT Time Calculation (min)  (ACUTE ONLY): 47 min   Charges:   PT Evaluation $Initial PT Evaluation Tier I: 1 Procedure PT Treatments $Gait Training: 8-22 mins $Therapeutic Exercise: 8-22 mins   PT G Codes:        Dillen Belmontes 11/03/2014, 12:01 PM

## 2014-11-03 NOTE — Discharge Summary (Signed)
Physician Discharge Summary  Patient ID: Mary Sawyer MRN: [id number] DOB/AGE: Jun 16, 1961 54 y.o.  Admit date: 11/02/2014 Discharge date: 11/03/2014  Admission Diagnoses:  DJD Left hip  Discharge Diagnoses:  Active Problems:   Primary osteoarthritis of left hip   Past Medical History  Diagnosis Date  . Dysrhythmia     MVP  . Heart murmur   . Depression   . Arthritis   . Anxiety   . Eczema     hx of   . ADD (attention deficit disorder)     hx of noted per H&P per Dr Wynelle Link 10/18/2014   . Hemorrhoids     hx of per H&P per Dr Wynelle Link 10/18/2014   . History of substance abuse     pt denies noted per H&P per Dr Wynelle Link 10/18/2014 ETOH and marijuana    Surgeries: Procedure(s): LEFT TOTAL HIP ARTHROPLASTY ANTERIOR APPROACH on 11/02/2014   Consultants (if any):    Discharged Condition: Improved  Hospital Course: Mary Sawyer is an 54 y.o. female who was admitted 11/02/2014 with a diagnosis of DJD left hip and went to the operating room on 11/02/2014 and underwent the above named procedures.    She was given perioperative antibiotics:  Anti-infectives    Start     Dose/Rate Route Frequency Ordered Stop   11/02/14 1800  ceFAZolin (ANCEF) IVPB 1 g/50 mL premix     1 g100 mL/hr over 30 Minutes Intravenous Every 6 hours 11/02/14 1655 11/03/14 0242   11/02/14 0934  ceFAZolin (ANCEF) IVPB 2 g/50 mL premix     2 g100 mL/hr over 30 Minutes Intravenous On call to O.R. 11/02/14 6578 11/02/14 1231    .  She was given sequential compression devices, early ambulation, and ASA  PO BID for DVT prophylaxis.  She benefited maximally from the hospital stay and there were no complications.    Recent vital signs:  Filed Vitals:   11/03/14 0519  BP: 93/54  Pulse: 68  Temp: 97.8 F (36.6 C)  Resp: 20    Recent laboratory studies:  Lab Results  Component Value Date   HGB 9.7* 11/03/2014   HGB 13.1 03/05/2010   HGB 12.3 09/29/2009   Lab Results  Component Value Date   WBC  10.5 11/03/2014   PLT 302 11/03/2014   Lab Results  Component Value Date   INR 1.09 10/23/2014   Lab Results  Component Value Date   NA 141 11/03/2014   K 3.8 11/03/2014   CL 107 11/03/2014   CO2 26 11/03/2014   BUN 14 11/03/2014   CREATININE 0.64 11/03/2014   GLUCOSE 126* 11/03/2014    Discharge Medications:     Medication List    STOP taking these medications        ibuprofen 800 MG tablet  Commonly known as:  ADVIL,MOTRIN     traMADol 50 MG tablet  Commonly known as:  ULTRAM      TAKE these medications        aspirin 325 MG EC tablet  Take 1 tablet (325 mg total) by mouth 2 (two) times daily after a meal.     diclofenac 75 MG EC tablet  Commonly known as:  VOLTAREN  Take 1 tablet (75 mg total) by mouth 2 (two) times daily.     docusate sodium 100 MG capsule  Commonly known as:  COLACE  Take 1 capsule (100 mg total) by mouth 2 (two) times daily.     famotidine 20 MG  tablet  Commonly known as:  PEPCID  Take 1 tablet (20 mg total) by mouth 2 (two) times daily.     gabapentin 300 MG capsule  Commonly known as:  NEURONTIN  Take 300 mg by mouth 3 (three) times daily.     HYDROcodone-acetaminophen 5-325 MG per tablet  Commonly known as:  NORCO/VICODIN  Take 1-2 tablets by mouth every 4 (four) hours as needed (breakthrough pain).     lamoTRIgine 200 MG tablet  Commonly known as:  LAMICTAL  Take 200 mg by mouth daily.     ondansetron 4 MG disintegrating tablet  Commonly known as:  ZOFRAN ODT  Take 1 tablet (4 mg total) by mouth every 8 (eight) hours as needed for nausea or vomiting.     senna 8.6 MG Tabs tablet  Commonly known as:  SENOKOT  Take 2 tablets (17.2 mg total) by mouth at bedtime.     vitamin C 500 MG tablet  Commonly known as:  ASCORBIC ACID  Take 500 mg by mouth daily.        Diagnostic Studies: Dg Pelvis Portable  11/02/2014   CLINICAL DATA:  Postop left hip surgery  EXAM: PORTABLE PELVIS 1-2 VIEWS  COMPARISON:  None.  FINDINGS: Total  left upper arthroplasty without periprosthetic fracture or dislocation. No evidence of pelvic fracture. The right hip is unremarkable. Tubal ligation clips incidentally noted.  IMPRESSION: Unremarkable total left hip arthroplasty.   Electronically Signed   By: Tiburcio Pea M.D.   On: 11/02/2014 16:44   Dg C-arm 61-120 Min-no Report  11/02/2014   CLINICAL DATA: left anterior hip   C-ARM 61-120 MINUTES  Fluoroscopy was utilized by the requesting physician.  No radiographic  interpretation.    Dg Hip Unilat With Pelvis 1v Left  11/02/2014   CLINICAL DATA:  54 year old female with a history of left hip fracture. Status post repair.  EXAM: LEFT HIP (WITH PELVIS) 1 VIEW  COMPARISON:  No available plain film for comparison.  FINDINGS: Intraoperative fluoroscopic spot images of the left hip.  Images demonstrate total left hip arthroplasty.  The acetabular and femoral components appear congruent.  No immediate complicating features identified.  IMPRESSION: Limited fluoroscopic spot images of a left-sided total hip arthroplasty demonstrate no immediate complicating features.  Please refer to the dictated operative report for full details of intraoperative findings and procedure.  Signed,  Yvone Neu. Loreta Ave, DO  [facility]  [facility]   Electronically Signed   By: Gilmer Mor D.O.   On: 11/02/2014 15:21    Disposition: Home      Discharge Instructions    Call MD / Call 911    Complete by:  As directed   If you experience chest pain or shortness of breath, CALL 911 and be transported to the hospital emergency room.  If you develope a fever above 101 F, pus (white drainage) or increased drainage or redness at the wound, or calf pain, call your surgeon's office.     Constipation Prevention    Complete by:  As directed   Drink plenty of fluids.  Prune juice may be helpful.  You may use a stool softener, such as Colace (over the counter) 100 mg twice a day.  Use  MiraLax (over the counter) for constipation as needed.     Diet - low sodium heart healthy    Complete by:  As directed      Driving restrictions    Complete by:  As  directed   No driving for 2-4 weeks (until cleared by MD)     Increase activity slowly as tolerated    Complete by:  As directed      Lifting restrictions    Complete by:  As directed   No lifting for 6 weeks     TED hose    Complete by:  As directed   Use stockings (TED hose) for 2 weeks on both leg(s).  You may remove them at night for sleeping.           Follow-up Information    Follow up with Anuar Walgren, Cloyde ReamsBrian James, MD. Call in 2 weeks.   Specialty:  Orthopedic Surgery   Why:  For wound re-check   Contact information:   [address] [place] [address] [phone]        Signed: Garnet KoyanagiSwinteck, Paige Monarrez James 11/03/2014, 8:27 AM

## 2014-11-03 NOTE — Progress Notes (Signed)
CARE MANAGEMENT NOTE 11/03/2014  Patient:  Mary Sawyer,Mary Sawyer   Account Number:  [id number]  Date Initiated:  11/03/2014  Documentation initiated by:  Nalu Troublefield  Subjective/Objective Assessment:   pt with LEFT TOTAL HIP ARTHROPLASTY ANTERIOR APPROACH (Left)     Action/Plan:   home   Anticipated DC Date:  11/04/2014   Anticipated DC Plan:  HOME W HOME HEALTH SERVICES  In-house referral  Clinical Social Worker      DC Planning Services  CM consult      [facility]  NA   Choice offered to / List presented to:  C-1 Patient        HH arranged  HH-2 PT      [facility]  [facility]   Status of service:  In process, will continue to follow Medicare Important Message given?   (If response is "NO", the following Medicare IM given date fields will be blank) Date Medicare IM given:   Medicare IM given by:   Date Additional Medicare IM given:   Additional Medicare IM given by:    Discharge Disposition:    Per UR Regulation:  Reviewed for med. necessity/level of care/duration of stay  If discussed at Long Length of Stay Meetings, dates discussed:    Comments:  [id number]/Mirai Greenwood Stark JockDavis, RN, BSN, CCN: [phone]/ Case management. Chart reviewed for discharge planning and present needs. Discharge needs: none present at time of review. Next chart review due:  [id number]

## 2014-11-03 NOTE — Discharge Instructions (Signed)
Dr. Rod Can Joint Replacement Specialist [facility] [address] [place], [place] [address] [phone]   TOTAL HIP REPLACEMENT POSTOPERATIVE DIRECTIONS    Hip Rehabilitation, Guidelines Following Surgery  The results of a hip operation are greatly improved after range of motion and muscle strengthening exercises. Follow all safety measures which are given to protect your hip. If any of these exercises cause increased pain or swelling in your joint, decrease the amount until you are comfortable again. Then slowly increase the exercises. Call your caregiver if you have problems or questions.  HOME CARE INSTRUCTIONS  Most of the following instructions are designed to prevent the dislocation of your new hip.  Remove items at home which could result in a fall. This includes throw rugs or furniture in walking pathways.  Continue medications as instructed at time of discharge.  You may have some home medications which will be placed on hold until you complete the course of blood thinner medication.  You may start showering once you are discharged home. Do not remove your dressing. Do not put on socks or shoes without following the instructions of your caregivers.   Sit on chairs with arms. Use the chair arms to help push yourself up when arising.  Arrange for the use of a toilet seat elevator so you are not sitting low.   Walk with walker as instructed.  You may resume a sexual relationship in one month or when given the OK by your caregiver.  Use walker as long as suggested by your caregivers.  You may put full weight on your legs and walk as much as is comfortable. Avoid periods of inactivity such as sitting longer than an hour when not asleep. This helps prevent blood clots.  You may return to work once you are cleared by Engineer, production.  Do not drive a car for 6 weeks or until released by your surgeon.  Do not drive while taking narcotics.  Wear  elastic stockings for two weeks following surgery during the day but you may remove then at night.  Make sure you keep all of your appointments after your operation with all of your doctors and caregivers. You should call the office at the above phone number and make an appointment for approximately two weeks after the date of your surgery. Please pick up a stool softener and laxative for home use as long as you are requiring pain medications.  ICE to the affected hip every three hours for 30 minutes at a time and then as needed for pain and swelling. Continue to use ice on the hip for pain and swelling from surgery. You may notice swelling that will progress down to the foot and ankle.  This is normal after surgery.  Elevate the leg when you are not up walking on it.   It is important for you to complete the blood thinner medication as prescribed by your doctor.  Continue to use the breathing machine which will help keep your temperature down.  It is common for your temperature to cycle up and down following surgery, especially at night when you are not up moving around and exerting yourself.  The breathing machine keeps your lungs expanded and your temperature down.  RANGE OF MOTION AND STRENGTHENING EXERCISES  These exercises are designed to help you keep full movement of your hip joint. Follow your caregiver's or physical therapist's instructions. Perform all exercises about fifteen times, three times per day or as directed. Exercise both  hips, even if you have had only one joint replacement. These exercises can be done on a training (exercise) mat, on the floor, on a table or on a bed. Use whatever works the best and is most comfortable for you. Use music or television while you are exercising so that the exercises are a pleasant break in your day. This will make your life better with the exercises acting as a break in routine you can look forward to.  Lying on your back, slowly slide your foot  toward your buttocks, raising your knee up off the floor. Then slowly slide your foot back down until your leg is straight again.  Lying on your back spread your legs as far apart as you can without causing discomfort.  Lying on your side, raise your upper leg and foot straight up from the floor as far as is comfortable. Slowly lower the leg and repeat.  Lying on your back, tighten up the muscle in the front of your thigh (quadriceps muscles). You can do this by keeping your leg straight and trying to raise your heel off the floor. This helps strengthen the largest muscle supporting your knee.  Lying on your back, tighten up the muscles of your buttocks both with the legs straight and with the knee bent at a comfortable angle while keeping your heel on the floor.   SKILLED REHAB INSTRUCTIONS: If the patient is transferred to a skilled rehab facility following release from the hospital, a list of the current medications will be sent to the facility for the patient to continue.  When discharged from the skilled rehab facility, please have the facility set up the patient's Home Health Physical Therapy prior to being released. Also, the skilled facility will be responsible for providing the patient with their medications at time of release from the facility to include their pain medication and their blood thinner medication. If the patient is still at the rehab facility at time of the two week follow up appointment, the skilled rehab facility will also need to assist the patient in arranging follow up appointment in our office and any transportation needs.  MAKE SURE YOU:  Understand these instructions.  Will watch your condition.  Will get help right away if you are not doing well or get worse.  Pick up stool softner and laxative for home use following surgery while on pain medications. Do not remove your dressing. The dressing is waterproof--it is OK to take showers. Continue to use ice for pain and  swelling after surgery. Do not use any lotions or creams on the incision until instructed by your surgeon. Total Hip Protocol.

## 2014-11-03 NOTE — Progress Notes (Signed)
Physical Therapy Treatment Patient Details Name: Mary Sawyer MRN: [id number] DOB: 02-Jan-1961 Today's Date: 11/03/2014    History of Present Illness s/p L DA THA;    PT Comments    POD # 1 pm session.  Assisted pt OOB by demonstrating and instructing how to use a belt to assist L LE off/onto bed.  amb a great distance in hallway.  Alternating gait and safe balance with walker.  Practiced stairs using one R rail. Returned to room to review THR TE's HEP following sheet.  Instructed on proper tech to get in/out high truck. Pt is ready to D/C to home.  Follow Up Recommendations  Home health PT     Equipment Recommendations  Rolling walker with 5" wheels    Recommendations for Other Services       Precautions / Restrictions Precautions Precautions: Fall Restrictions Weight Bearing Restrictions: No Other Position/Activity Restrictions: WBAT    Mobility  Bed Mobility Overal bed mobility: Needs Assistance Bed Mobility: Supine to Sit;Sit to Supine     Supine to sit: Min guard Sit to supine: Min guard   General bed mobility comments: demonstrated and instructed pt how to use a belt to assist l LE off and on to bed.  Transfers Overall transfer level: Needs assistance Equipment used: Rolling walker (2 wheeled) Transfers: Sit to/from Stand Sit to Stand: Supervision         General transfer comment: increased time and one VC safety with backward gait  Ambulation/Gait Ambulation/Gait assistance: Supervision;Min guard Ambulation Distance (Feet): 250 Feet Assistive device: Rolling walker (2 wheeled) Gait Pattern/deviations: Step-through pattern;Decreased stride length Gait velocity: decreased but functional   General Gait Details: one VC for safety around obsticles using RW   Stairs Stairs: Yes Stairs assistance: Min assist Stair Management: One rail Right;Step to pattern;Forwards Number of Stairs: 4 General stair comments: 25% Vc's on proper sequencing and  safety  Wheelchair Mobility    Modified Rankin (Stroke Patients Only)       Balance                                    Cognition Arousal/Alertness: Awake/alert Behavior During Therapy: WFL for tasks assessed/performed Overall Cognitive Status: Within Functional Limits for tasks assessed                      Exercises Total Joint Exercises Ankle Circles/Pumps: AROM;Both;10 reps Quad Sets: AROM;10 reps;Both Short Arc Quad: AROM;Left;10 reps Heel Slides: AROM;Left;10 reps;AAROM Hip ABduction/ADduction: AROM;AAROM;Left;15 reps    General Comments        Pertinent Vitals/Pain Pain Assessment: 0-10 Pain Score: 2  Pain Location: L hip  Pain Descriptors / Indicators: Sore Pain Intervention(s): Monitored during session;Premedicated before session;Repositioned;Ice applied    Home Living Family/patient expects to be discharged to:: Private residence Living Arrangements: Alone Available Help at Discharge: Family Type of Home: Apartment Home Access: Stairs to enter   Home Layout: One level Home Equipment: Shower seat - built in Additional Comments: pt will stay downstairs with friend.  She thinks toilet may be higher.  Has a sink and grab bar next to it    Prior Function Level of Independence: Independent          PT Goals (current goals can now be found in the care plan section) Acute Rehab PT Goals Patient Stated Goal: get back to being independent and getting back to her 2nd  floor apt PT Goal Formulation: With patient Time For Goal Achievement: 11/10/14 Potential to Achieve Goals: Good Progress towards PT goals: Progressing toward goals    Frequency  7X/week    PT Plan      Co-evaluation             End of Session Equipment Utilized During Treatment: Gait belt Activity Tolerance: Patient tolerated treatment well Patient left: in bed;with call bell/phone within reach     Time: 1344-1408 PT Time Calculation (min) (ACUTE  ONLY): 24 min  Charges:  $Gait Training: 8-22 mins $Therapeutic Activity: 8-22 mins                    G Codes:      Felecia ShellingLori Eulanda Dorion  PTA [facility]  Acute  Rehab Pager      [phone]

## 2014-11-03 NOTE — Progress Notes (Signed)
RN reviewed discharge instructions with patient. All questions answered.   Paperwork and prescriptions given.   NT rolled patient down in wheelchair to family car.  

## 2014-11-03 NOTE — Progress Notes (Signed)
   Subjective:  Patient reports pain as moderate.  No c/o. Denies N/V/CP/SOB.  Objective:   VITALS:   Filed Vitals:   11/02/14 1800 11/02/14 1900 11/02/14 2000 11/03/14 0519  BP: 110/62 111/60 105/58 93/54  Pulse: 64 70 71 68  Temp: 98.6 F (37 C) 99.5 F (37.5 C) 98.8 F (37.1 C) 97.8 F (36.6 C)  TempSrc: Oral Oral Oral Oral  Resp: 18 18 18 20   Height:      Weight:      SpO2: 100% 99% 99% 100%    ABD soft Sensation intact distally Intact pulses distally Dorsiflexion/Plantar flexion intact Incision: dressing C/D/I   Lab Results  Component Value Date   WBC 10.5 11/03/2014   HGB 9.7* 11/03/2014   HCT 30.2* 11/03/2014   MCV 90.4 11/03/2014   PLT 302 11/03/2014   BMET    Component Value Date/Time   NA 141 11/03/2014 0516   K 3.8 11/03/2014 0516   CL 107 11/03/2014 0516   CO2 26 11/03/2014 0516   GLUCOSE 126* 11/03/2014 0516   BUN 14 11/03/2014 0516   CREATININE 0.64 11/03/2014 0516   CALCIUM 8.8 11/03/2014 0516   GFRNONAA >90 11/03/2014 0516   GFRAA >90 11/03/2014 0516     Assessment/Plan: 1 Day Post-Op   Active Problems:   Primary osteoarthritis of left hip   WBAT with walker Advance diet Up with therapy D/C IV fluids Discharge home with home health  DVT ppx: ASA, SCDs Dispo: d/c home after clears PT/OT, today vs tomorrow   Garnet KoyanagiSwinteck, Lindsay Soulliere James 11/03/2014, 8:15 AM   Samson FredericBrian Matteo Banke, MD Cell [phone]

## 2014-11-03 NOTE — Evaluation (Signed)
Occupational Therapy Evaluation Patient Details Name: Mary Sawyer MRN: [id number] DOB: 10/05/1960 Today's Date: 11/03/2014    History of Present Illness s/p L DA THA;   Clinical Impression   This 54 year old female was admitted for the above surgery.  All education was completed.  No further OT is needed at this time    Follow Up Recommendations  No OT follow up    Equipment Recommendations  None recommended by OT    Recommendations for Other Services       Precautions / Restrictions Precautions Precautions: Fall Restrictions Weight Bearing Restrictions: No Other Position/Activity Restrictions: WBAT      Mobility Bed Mobility Overal bed mobility: Needs Assistance Bed Mobility: Sit to Supine     Supine to sit: Min assist Sit to supine: Min assist   General bed mobility comments: light assist for LLE  Transfers Overall transfer level: Needs assistance Equipment used: Rolling walker (2 wheeled) Transfers: Sit to/from Stand Sit to Stand: Supervision         General transfer comment: cues for hand placement    Balance                                            ADL Overall ADL's : Needs assistance/impaired             Lower Body Bathing: Minimal assistance;Sit to/from stand       Lower Body Dressing: Minimal assistance;Sit to/from stand   Toilet Transfer: Supervision/safety;Ambulation;Grab bars (high commode)   Toileting- Clothing Manipulation and Hygiene: Supervision/safety;Sit to/from stand   Tub/ Shower Transfer: Walk-in shower;Supervision/safety;Ambulation     General ADL Comments: educated on and practiced bathroom transfers.  Pt needs min A for LB adls.  She can perform UB with set up.  Cue given for hand placement when standing:  she initiated extending sore leg without cues.  Pt states there is a built in seat in the shower.  Recommended she have friend sit on this first to make sure that it isn't slippery.  If  she wishes to perform standing shower, recommended that she makes sure she is ready and has good balance, then keep both feet on the floor and later sit on commode to wash feet.  She will need to follow the MDs recommendation on when she is allowed to shower     Vision                     Perception     Praxis      Pertinent Vitals/Pain Pain Assessment: 0-10 Pain Score: 2  Pain Location: L hip Pain Descriptors / Indicators: Sore Pain Intervention(s): Limited activity within patient's tolerance;Monitored during session;Premedicated before session;Repositioned;Ice applied     Hand Dominance     Extremity/Trunk Assessment Upper Extremity Assessment Upper Extremity Assessment: Overall WFL for tasks assessed          Communication Communication Communication: No difficulties   Cognition Arousal/Alertness: Awake/alert Behavior During Therapy: WFL for tasks assessed/performed Overall Cognitive Status: Within Functional Limits for tasks assessed                     General Comments       Exercises       Shoulder Instructions      Home Living Family/patient expects to be discharged to:: Private residence Living Arrangements: Alone Available  Help at Discharge: Family Type of Home: Apartment Home Access: Stairs to enter Secretary/administrator of Steps: 3   Home Layout: One level     Bathroom Shower/Tub: Producer, television/film/video: Standard (? comfort height)     Home Equipment: Shower seat - built in   Additional Comments: pt will stay downstairs with friend.  She thinks toilet may be higher.  Has a sink and grab bar next to it      Prior Functioning/Environment Level of Independence: Independent             OT Diagnosis: Generalized weakness   OT Problem List:     OT Treatment/Interventions:      OT Goals(Current goals can be found in the care plan section) Acute Rehab OT Goals Patient Stated Goal: get back to being  independent and getting back to her 2nd floor apt  OT Frequency:     Barriers to D/C:            Co-evaluation              End of Session    Activity Tolerance: Patient tolerated treatment well Patient left: in bed;with call bell/phone within reach   Time: 1314-1326 OT Time Calculation (min): 12 min Charges:  OT General Charges $OT Visit: 1 Procedure OT Evaluation $Initial OT Evaluation Tier I: 1 Procedure G-Codes:    Wilburt Messina 11/22/2014, 1:58 PM Marica Otter, OTR/L [phone] 2014-11-22

## 2015-07-04 IMAGING — RF DG HIP (WITH OR WITHOUT PELVIS) 1V*L*
1 series · 2 of 2 positions shown · non-contrast
Comparison: No available plain film for comparison.

CLINICAL DATA: 54-year-old female with a history of left hip
fracture. Status post repair.

EXAM:
LEFT HIP (WITH PELVIS) 1 VIEW

[Series 1: run · 2 of 2 slices shown]
[im 1/2]
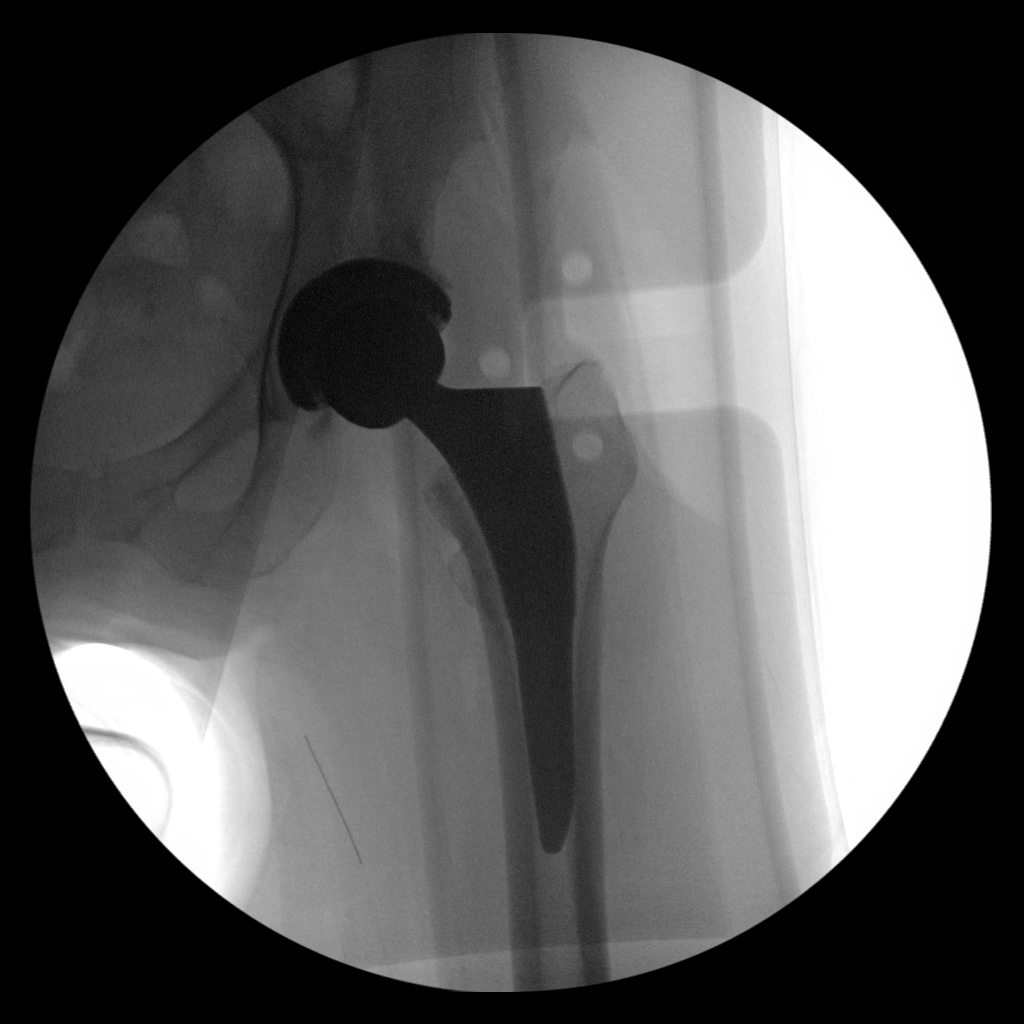
[im 2/2]
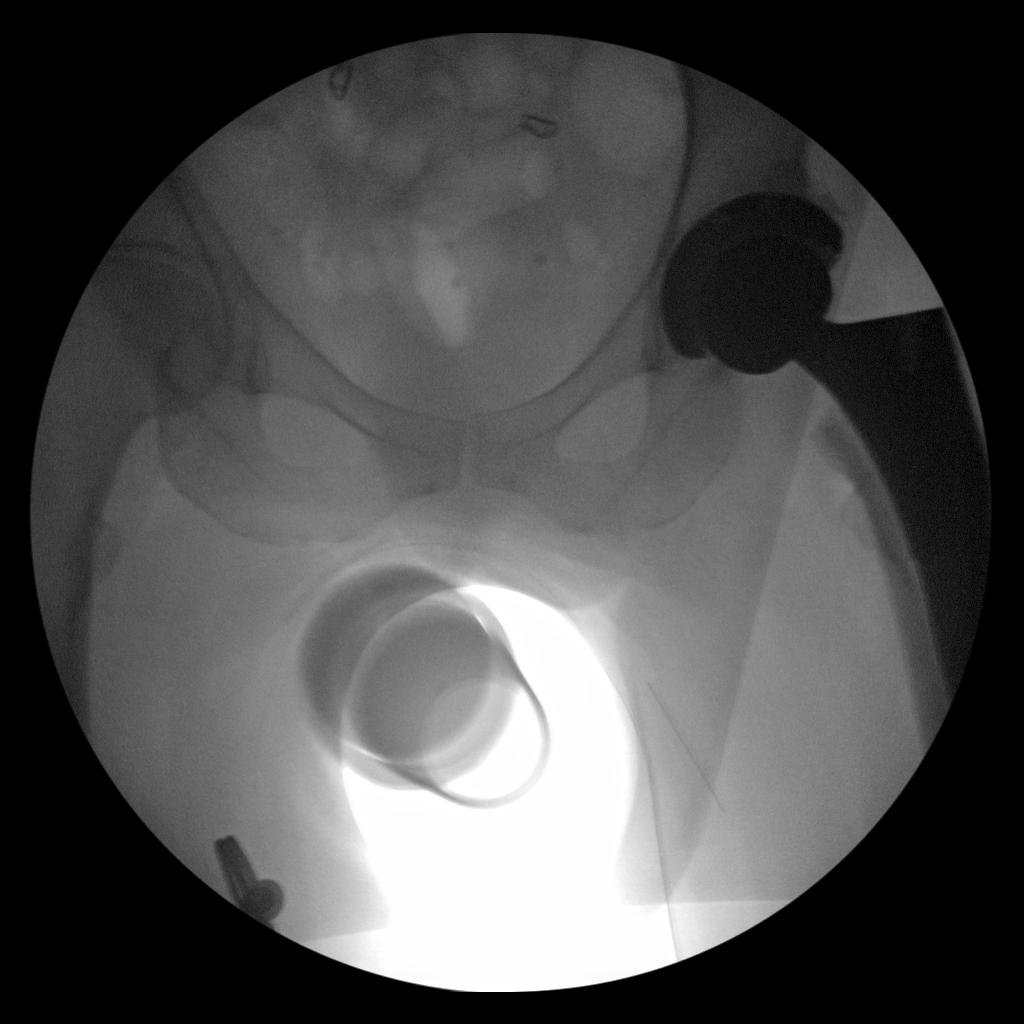

[2 of 2 positions shown; findings below may reference images not displayed]

FINDINGS: Intraoperative fluoroscopic spot images of the left hip.

Images demonstrate total left hip arthroplasty.

The acetabular and femoral components appear congruent.

No immediate complicating features identified.
IMPRESSION: Limited fluoroscopic spot images of a left-sided total hip
arthroplasty demonstrate no immediate complicating features.

Please refer to the dictated operative report for full details of
intraoperative findings and procedure.

## 2015-07-04 IMAGING — DX DG PORTABLE PELVIS
1 series · 1 of 1 positions shown · non-contrast
Comparison: None.

CLINICAL DATA: Postop left hip surgery

EXAM:
PORTABLE PELVIS 1-2 VIEWS

[pelvis ap]
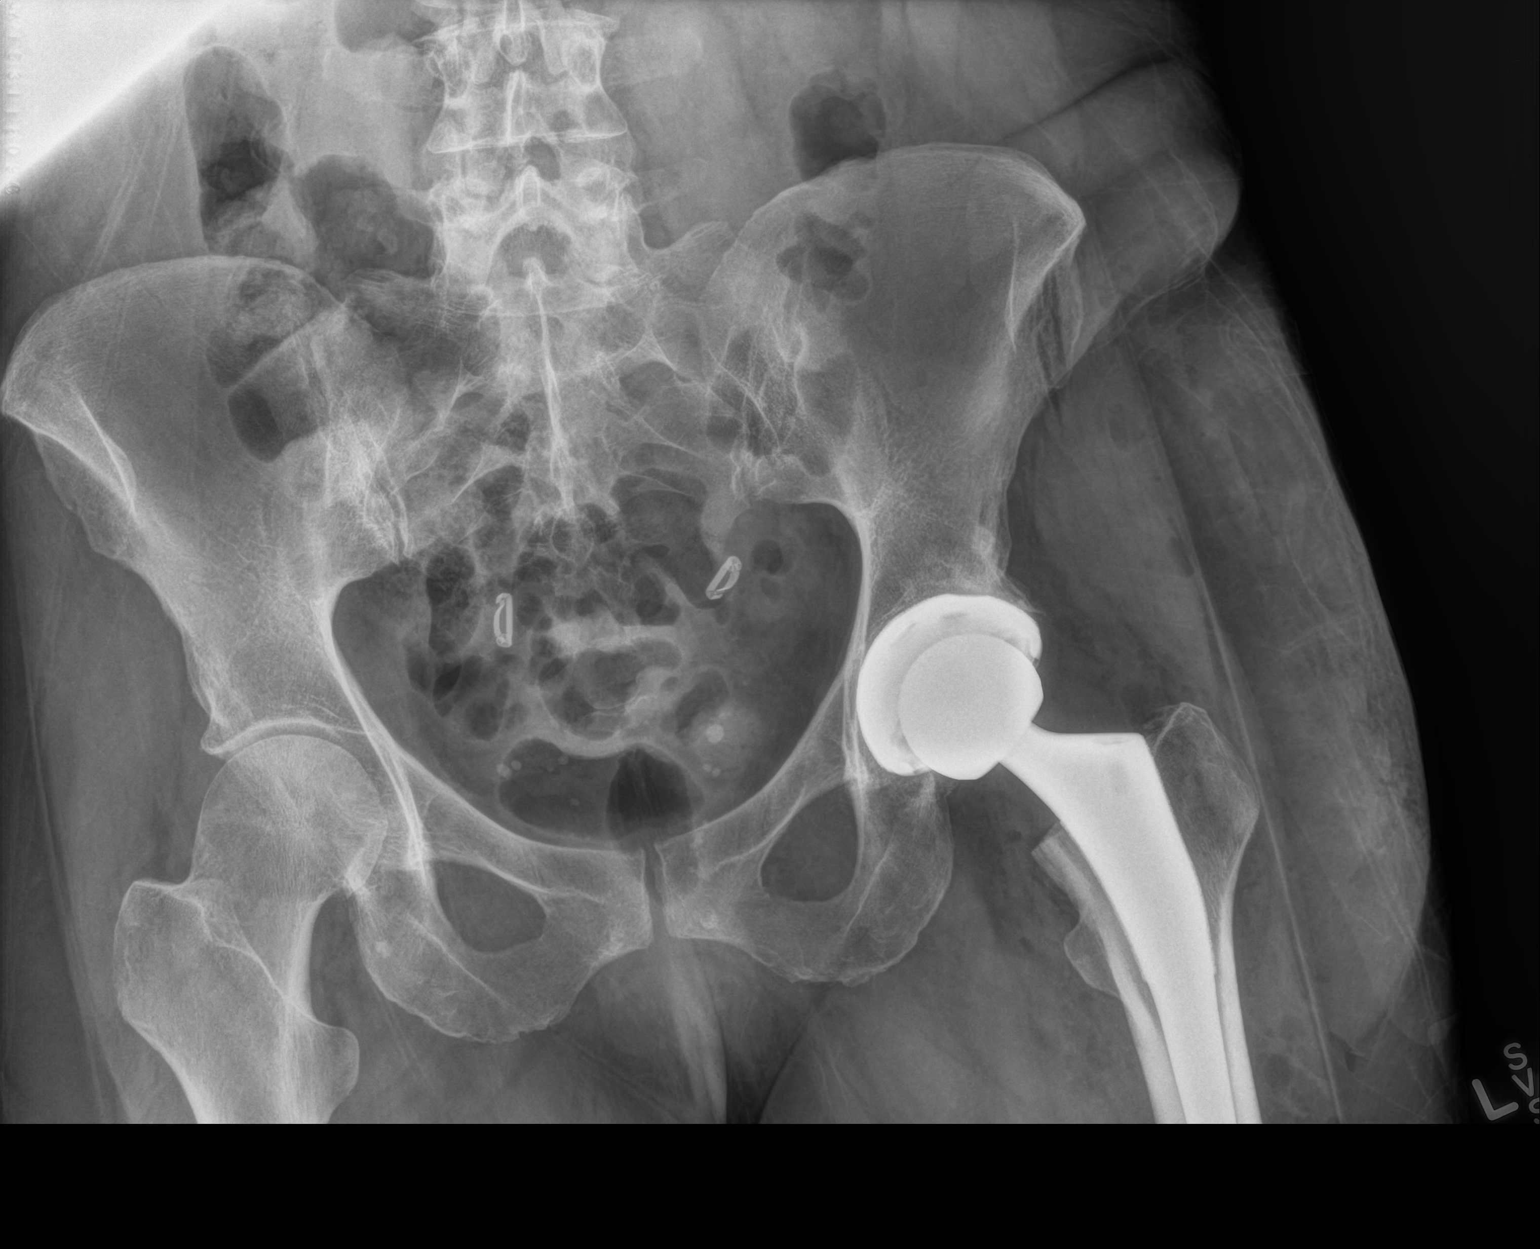

[1 of 1 positions shown; findings below may reference images not displayed]

FINDINGS: Total left upper arthroplasty without periprosthetic fracture or
dislocation. No evidence of pelvic fracture. The right hip is
unremarkable. Tubal ligation clips incidentally noted.
IMPRESSION: Unremarkable total left hip arthroplasty.
